# Patient Record
Sex: Female | Born: 1975 | Race: White | Hispanic: No | Marital: Married | Smoking: Never smoker
Health system: Southern US, Community
[De-identification: ages and names within clinical notes are randomized; demographics above are authoritative.]

## PROBLEM LIST (undated history)

## (undated) DIAGNOSIS — R079 Chest pain, unspecified: Secondary | ICD-10-CM

## (undated) DIAGNOSIS — E119 Type 2 diabetes mellitus without complications: Secondary | ICD-10-CM

## (undated) DIAGNOSIS — I10 Essential (primary) hypertension: Secondary | ICD-10-CM

## (undated) DIAGNOSIS — F419 Anxiety disorder, unspecified: Secondary | ICD-10-CM

## (undated) DIAGNOSIS — R609 Edema, unspecified: Secondary | ICD-10-CM

## (undated) DIAGNOSIS — Z87442 Personal history of urinary calculi: Secondary | ICD-10-CM

## (undated) HISTORY — PX: KNEE SURGERY: SHX244

## (undated) HISTORY — DX: Anxiety disorder, unspecified: F41.9

## (undated) HISTORY — DX: Personal history of urinary calculi: Z87.442

## (undated) HISTORY — DX: Type 2 diabetes mellitus without complications: E11.9

## (undated) HISTORY — PX: HYSTEROSCOPY: SHX211

## (undated) HISTORY — DX: Essential (primary) hypertension: I10

## (undated) HISTORY — DX: Chest pain, unspecified: R07.9

## (undated) HISTORY — DX: Edema, unspecified: R60.9

## (undated) HISTORY — PX: OVARIAN CYST REMOVAL: SHX89

---

## 1998-09-09 ENCOUNTER — Other Ambulatory Visit: Admission: RE | Admit: 1998-09-09 | Discharge: 1998-09-09 | Payer: Self-pay | Admitting: Obstetrics and Gynecology

## 2016-01-11 ENCOUNTER — Encounter (HOSPITAL_COMMUNITY): Payer: Self-pay | Admitting: Emergency Medicine

## 2016-01-11 ENCOUNTER — Emergency Department (HOSPITAL_COMMUNITY)
Admission: EM | Admit: 2016-01-11 | Discharge: 2016-01-11 | Disposition: A | Discharge status: Home / Self Care | Payer: No Typology Code available for payment source | Attending: Emergency Medicine | Admitting: Emergency Medicine

## 2016-01-11 DIAGNOSIS — Y999 Unspecified external cause status: Secondary | ICD-10-CM | POA: Insufficient documentation

## 2016-01-11 DIAGNOSIS — Y92410 Unspecified street and highway as the place of occurrence of the external cause: Secondary | ICD-10-CM | POA: Insufficient documentation

## 2016-01-11 DIAGNOSIS — S29012A Strain of muscle and tendon of back wall of thorax, initial encounter: Secondary | ICD-10-CM | POA: Diagnosis not present

## 2016-01-11 DIAGNOSIS — Y9389 Activity, other specified: Secondary | ICD-10-CM | POA: Diagnosis not present

## 2016-01-11 DIAGNOSIS — Z792 Long term (current) use of antibiotics: Secondary | ICD-10-CM | POA: Diagnosis not present

## 2016-01-11 DIAGNOSIS — S161XXA Strain of muscle, fascia and tendon at neck level, initial encounter: Secondary | ICD-10-CM | POA: Diagnosis not present

## 2016-01-11 DIAGNOSIS — S299XXA Unspecified injury of thorax, initial encounter: Secondary | ICD-10-CM | POA: Diagnosis present

## 2016-01-11 DIAGNOSIS — Z79899 Other long term (current) drug therapy: Secondary | ICD-10-CM | POA: Diagnosis not present

## 2016-01-11 DIAGNOSIS — S39012A Strain of muscle, fascia and tendon of lower back, initial encounter: Secondary | ICD-10-CM

## 2016-01-11 LAB — URINALYSIS, ROUTINE W REFLEX MICROSCOPIC
Bilirubin Urine: NEGATIVE
Glucose, UA: NEGATIVE mg/dL
KETONES UR: NEGATIVE mg/dL
LEUKOCYTES UA: NEGATIVE
Nitrite: NEGATIVE
PROTEIN: NEGATIVE mg/dL
Specific Gravity, Urine: 1.01 (ref 1.005–1.030)
pH: 6 (ref 5.0–8.0)

## 2016-01-11 LAB — URINE MICROSCOPIC-ADD ON: WBC, UA: NONE SEEN WBC/hpf (ref 0–5)

## 2016-01-11 LAB — POC URINE PREG, ED: PREG TEST UR: NEGATIVE

## 2016-01-11 MED ORDER — CHLORZOXAZONE 500 MG PO TABS: 500.0000 mg | ORAL_TABLET | Freq: Three times a day (TID) | ORAL | 0 refills | Status: DC | PRN

## 2016-01-11 MED ORDER — DICLOFENAC SODIUM 75 MG PO TBEC: 75.0000 mg | DELAYED_RELEASE_TABLET | Freq: Two times a day (BID) | ORAL | 0 refills | Status: DC

## 2016-01-11 NOTE — Discharge Instructions (Signed)
Your vital signs within normal limits. Please use Tylenol for mild discomfort. Please use Parafon forte for muscle strain/pain. Use diclofenac 2 times daily for inflammation and pain. Parafon forte may cause drowsiness, please do not drink alcohol, drive a vehicle, operating machinery, or participate in activities requiring concentration when taking this medication. Please see your primary physician, or return to the emergency department if any changes in your condition.
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
## 2019-01-15 ENCOUNTER — Other Ambulatory Visit: Payer: Self-pay | Admitting: *Deleted

## 2019-01-15 DIAGNOSIS — Z20822 Contact with and (suspected) exposure to covid-19: Secondary | ICD-10-CM

## 2019-01-17 LAB — NOVEL CORONAVIRUS, NAA: SARS-CoV-2, NAA: NOT DETECTED

## 2019-06-09 ENCOUNTER — Ambulatory Visit: Payer: BC Managed Care – PPO | Attending: Internal Medicine

## 2019-06-09 DIAGNOSIS — Z23 Encounter for immunization: Secondary | ICD-10-CM | POA: Insufficient documentation

## 2019-06-09 NOTE — Progress Notes (Signed)
   Covid-19 Vaccination Clinic  Name:  Yvette Patterson    MRN: [id number] DOB: [birth year]  06/09/2019  [name] was observed post Covid-19 immunization for 15 minutes without incident. She was provided with Vaccine Information Sheet and instruction to access the V-Safe system.   [name] was instructed to call 911 with any severe reactions post vaccine: Marland Kitchen Difficulty breathing  . Swelling of face and throat  . A fast heartbeat  . A bad rash all over body  . Dizziness and weakness   Immunizations Administered    Name Date Dose VIS Date Route   Pfizer COVID-19 Vaccine 06/09/2019  8:42 AM 0.3 mL 03/15/2019 Intramuscular   Manufacturer: [employer], [employer]   Lot: [id number]   NDC: [address]-3

## 2019-06-30 ENCOUNTER — Ambulatory Visit: Payer: Self-pay | Attending: Internal Medicine

## 2019-06-30 DIAGNOSIS — Z23 Encounter for immunization: Secondary | ICD-10-CM

## 2019-06-30 NOTE — Progress Notes (Signed)
   Covid-19 Vaccination Clinic  Name:  RYA RAUSCH    MRN: [id number] DOB: 1975-09-01  06/30/2019  Ms. Pensyl was observed post Covid-19 immunization for 15 minutes without incident. She was provided with Vaccine Information Sheet and instruction to access the V-Safe system.   Ms. Cobern was instructed to call 911 with any severe reactions post vaccine: Marland Kitchen Difficulty breathing  . Swelling of face and throat  . A fast heartbeat  . A bad rash all over body  . Dizziness and weakness   Immunizations Administered    Name Date Dose VIS Date Route   Pfizer COVID-19 Vaccine 06/30/2019  8:43 AM 0.3 mL 03/15/2019 Intramuscular   Manufacturer: [employer], [employer]   Lot: [id number]   NDC: [address]-9

## 2019-08-12 ENCOUNTER — Ambulatory Visit: Payer: BC Managed Care – PPO | Admitting: Urology

## 2019-08-20 ENCOUNTER — Ambulatory Visit (INDEPENDENT_AMBULATORY_CARE_PROVIDER_SITE_OTHER): Payer: BC Managed Care – PPO | Admitting: Urology

## 2019-08-20 ENCOUNTER — Other Ambulatory Visit (HOSPITAL_COMMUNITY)
Admission: RE | Admit: 2019-08-20 | Discharge: 2019-08-20 | Disposition: A | Discharge status: Home / Self Care | Payer: BC Managed Care – PPO | Source: Other Acute Inpatient Hospital | Attending: Urology | Admitting: Urology

## 2019-08-20 ENCOUNTER — Encounter: Payer: Self-pay | Admitting: Urology

## 2019-08-20 ENCOUNTER — Other Ambulatory Visit: Payer: Self-pay

## 2019-08-20 VITALS — BP 148/92 | HR 88 | Temp 97.7°F | Ht 67.0 in | Wt 300.0 lb

## 2019-08-20 DIAGNOSIS — R300 Dysuria: Secondary | ICD-10-CM | POA: Insufficient documentation

## 2019-08-20 DIAGNOSIS — R319 Hematuria, unspecified: Secondary | ICD-10-CM | POA: Diagnosis not present

## 2019-08-20 LAB — POCT URINALYSIS DIPSTICK
Bilirubin, UA: NEGATIVE
Glucose, UA: NEGATIVE
Ketones, UA: NEGATIVE
Nitrite, UA: NEGATIVE
Protein, UA: POSITIVE — AB
Spec Grav, UA: 1.025 (ref 1.010–1.025)
Urobilinogen, UA: 0.2 E.U./dL
pH, UA: 6 (ref 5.0–8.0)

## 2019-08-20 NOTE — Progress Notes (Signed)
See H& P.

## 2019-08-20 NOTE — Progress Notes (Signed)
H&P  Chief Complaint: Recurrent Cystitis   History of Present Illness: Yvette Patterson is a 44 y.o. year old female who presents today having had recurrent urinary tract infections from February to April 2021. At onset of her sx's, she first noticed increased bladder pressure with associated urinary freq and urgency. She eventually developed dysuria, flank pain, as well as occasional blood per urine (very light). She tested positive on UA for infection and was subsequently treated -- her sx's persisted through multiple courses of abx and only recently resolved (last felt symptoms in early April). Her urine cultures were positive for strep and e. Coli. She felt that the abx's she was given improved her sx's mildly but not completely (which is what prompted her to return for further treatment and different antibiotics). She denies any baseline urinary issues nor a common sense of incomplete emptying. She has had no recent imaging for her kidneys/bladder. She has had yeast infections since starting on abx's for her recurrent UTI's.   She does have a hx of kidney stones (at least 2 episodes) and does not think she required any interventions for these -- her memory of these events are not very clear.   Past Medical History:  Diagnosis Date  . Anxiety   . Diabetes mellitus without complication ([facility])   . History of kidney stones   . Hypertension     Past Surgical History:  Procedure Laterality Date  . HYSTEROSCOPY    . KNEE SURGERY    . OVARIAN CYST REMOVAL      Home Medications:  (Not in a hospital admission)   Allergies: No Known Allergies  Family History  Problem Relation Age of Onset  . Colon cancer Father   . Diabetes Father   . Stroke Father   . Kidney Stones Mother   . Colon cancer Paternal Grandfather     Social History:  reports that she has never smoked. She has never used smokeless tobacco. She reports current alcohol use. She reports that she does not use  drugs.  ROS: Urological Symptom Review Patient is experiencing the following symptoms: Get up at night to urinate Urinary tract infection Kidney Stones Review of Systems Gastrointestinal (upper)  : Negative for upper GI symptoms Gastrointestinal (lower) : Negative for lower GI symptoms Constitutional : Fever Skin: Negative for skin symptoms Eyes: Negative for eye symptoms Ear/Nose/Throat : Negative for Ear/Nose/Throat symptoms Hematologic/Lymphatic: Negative for Hematologic/Lymphatic symptoms Cardiovascular : Leg swelling Respiratory : Negative for respiratory symptoms Endocrine: Negative for endocrine symptoms Musculoskeletal: Negative for musculoskeletal symptoms Neurological: Negative for neurological symptoms Psychologic: Anxiety  Physical Exam:  Vital signs in last 24 hours: Temp:  [97.7 F (36.5 C)] 97.7 F (36.5 C) (05/18 1524) Pulse Rate:  [88] 88 (05/18 1524) BP: (148)/(92) 148/92 (05/18 1524) Weight:  [300 lb (136.1 kg)] 300 lb (136.1 kg) (05/18 1524) General:  Alert and oriented, No acute distress, Obese HEENT: Normocephalic, atraumatic Neck: No JVD or lymphadenopathy Cardiovascular: Regular rate  Lungs: Normal inspiratory/expiratory excursion Abdomen: Obese Extremities: No edema Neurologic: Grossly intact  Laboratory Data:  Results for orders placed or performed in visit on 08/20/19 (from the past 24 hour(s))  POCT urinalysis dipstick     Status: Abnormal   Collection Time: 08/20/19  3:32 PM  Result Value Ref Range   Color, UA yellow    Clarity, UA     Glucose, UA Negative Negative   Bilirubin, UA neg    Ketones, UA neg    Spec Grav, UA  1.025 1.010 - 1.025   Blood, UA +    pH, UA 6.0 5.0 - 8.0   Protein, UA Positive (A) Negative   Urobilinogen, UA 0.2 0.2 or 1.0 E.U./dL   Nitrite, UA neg    Leukocytes, UA Moderate (2+) (A) Negative   Appearance clear    Odor     I have reviewed prior pt notes  I have reviewed notes from  referring/previous physicians  I have reviewed urinalysis results  Impression/Assessment:  She has not felt as thought she has been symptomatic since early April. UA today is POS for leukocytes and trace blood -- will culture. Sx's could be secondary to kidney stone given her hx though they just as well could have been due to UTI that has since been treated (especially with her recent pelvic surgery). Will also eval with RUS.   Plan:  1. Urine culture -- will forward results.  2. RUS -- will call to follow-up with results and plan appropriate next steps if needed  3. Advised to start on daily probiotic for a few days.   Luella Cook 08/20/2019, 3:54 PM  Bertram Millard. Dahlstedt MD

## 2019-08-22 LAB — URINE CULTURE: Culture: 80000 — AB

## 2019-08-23 ENCOUNTER — Telehealth: Payer: Self-pay

## 2019-08-23 NOTE — Telephone Encounter (Signed)
Pt calling for urine culture results. MD message sent

## 2019-08-23 NOTE — Telephone Encounter (Signed)
Let patient know that urine culture was positive, a few bacteria grew out, commonly found in healthy people.  At this point, since she is not symptomatic, I would strongly recommend against treatment.  I think she should continue on cranberry capsules, drinking plenty of fluids.  If she becomes symptomatic later she can let us know.    This results did not show up  in my box before  you sent me the message.

## 2019-08-23 NOTE — Telephone Encounter (Signed)
Called pt and left message

## 2019-09-10 ENCOUNTER — Ambulatory Visit (HOSPITAL_COMMUNITY): Payer: BC Managed Care – PPO

## 2019-09-17 ENCOUNTER — Other Ambulatory Visit: Payer: Self-pay

## 2019-09-17 ENCOUNTER — Ambulatory Visit (HOSPITAL_COMMUNITY)
Admission: RE | Admit: 2019-09-17 | Discharge: 2019-09-17 | Disposition: A | Discharge status: Home / Self Care | Payer: BC Managed Care – PPO | Source: Ambulatory Visit | Attending: Urology | Admitting: Urology

## 2019-09-17 DIAGNOSIS — R319 Hematuria, unspecified: Secondary | ICD-10-CM

## 2019-09-17 IMAGING — US US RENAL
1 series · 14 of 25 positions shown · non-contrast
Comparison: None.

CLINICAL DATA: 44-year-old female with recurrent urinary tract
infections. Stones.

EXAM:
RENAL / URINARY TRACT ULTRASOUND COMPLETE

[Series 1: us renal · 14 of 43 slices shown]
[im 1/43]
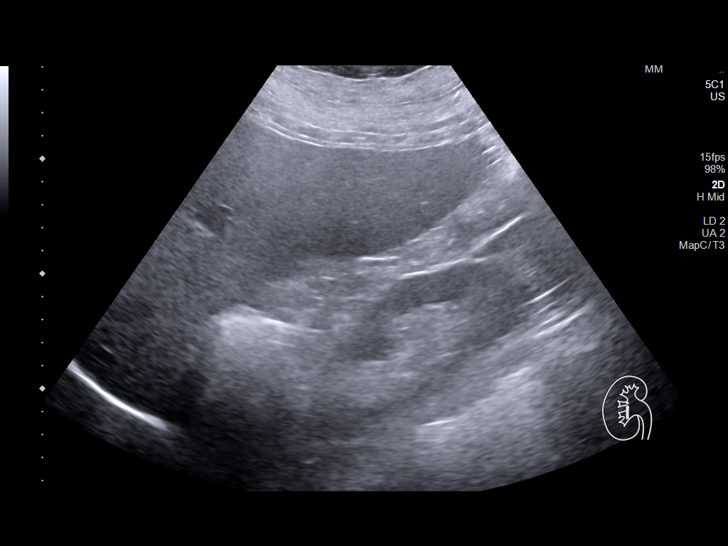
[im 4/43]
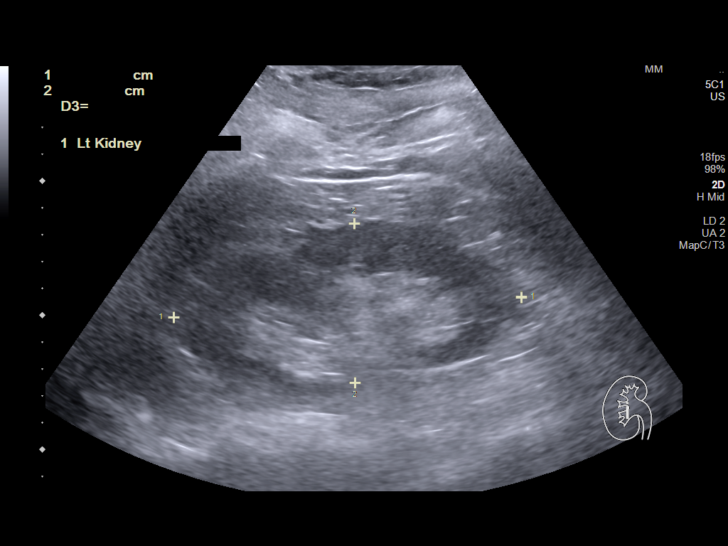
[im 8/43]
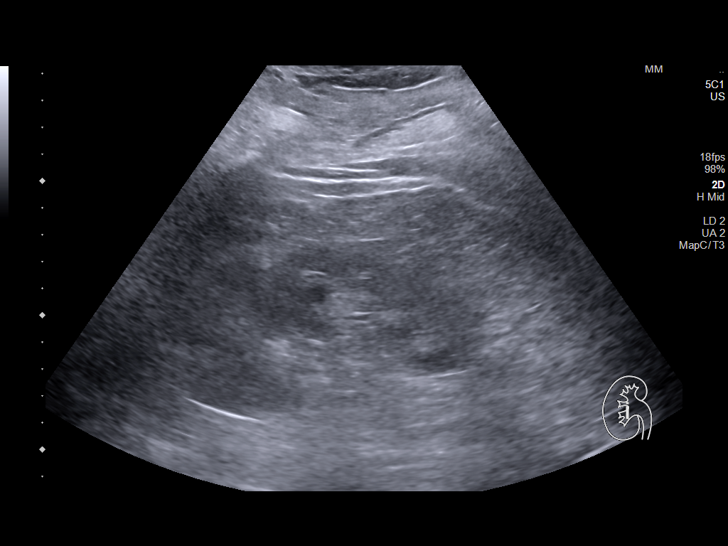
[im 11/43]
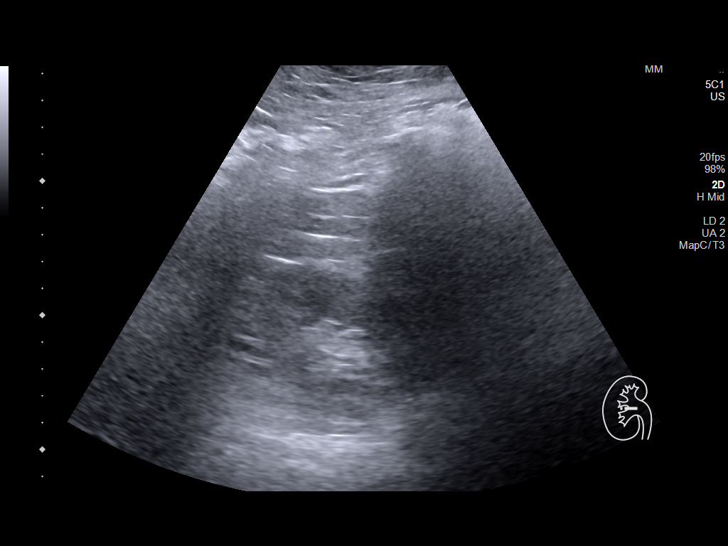
[im 15/43]
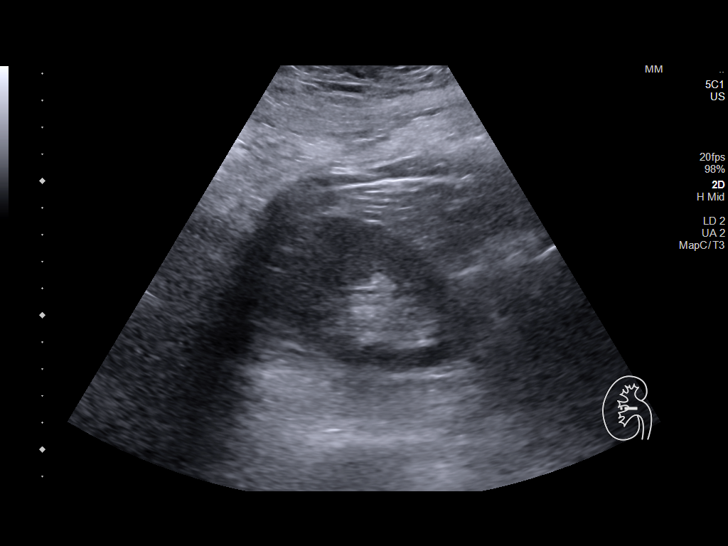
[im 16/43]
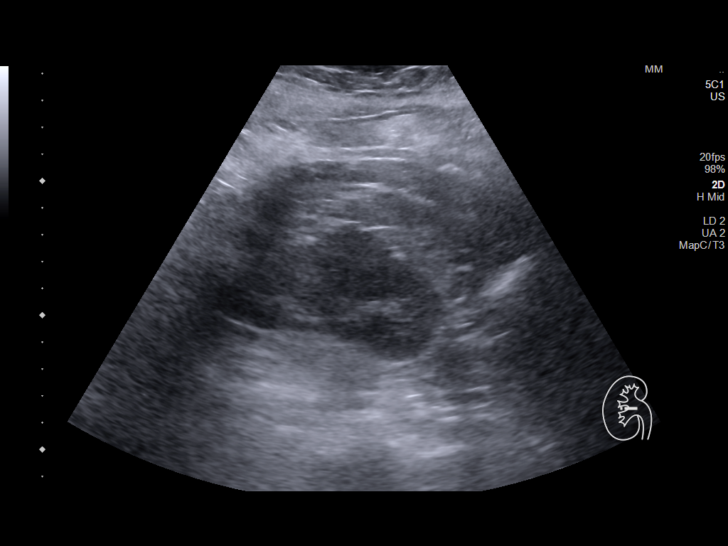
[im 20/43]
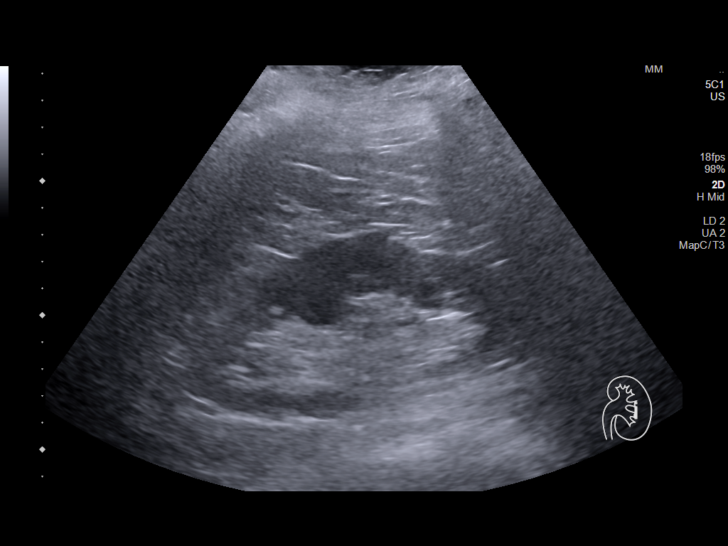
[im 23/43]
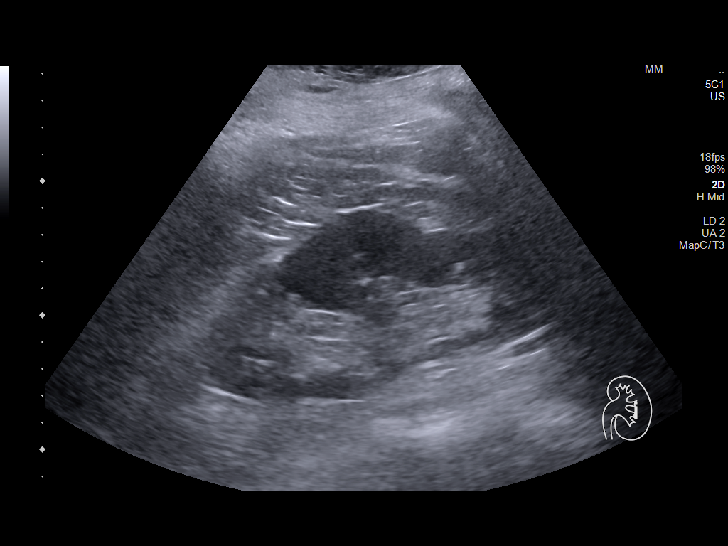
[im 27/43]
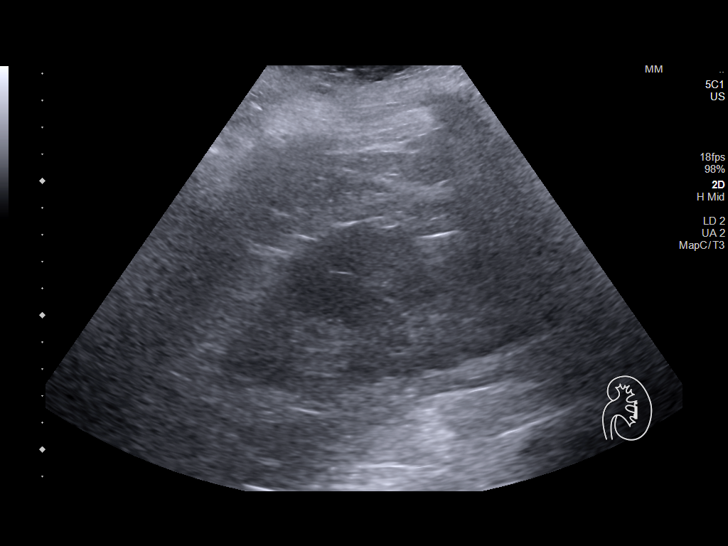
[im 29/43]
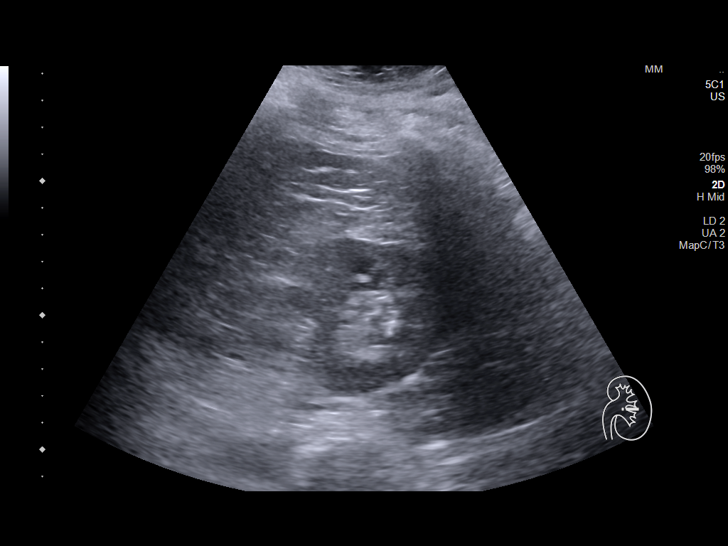
[im 32/43]
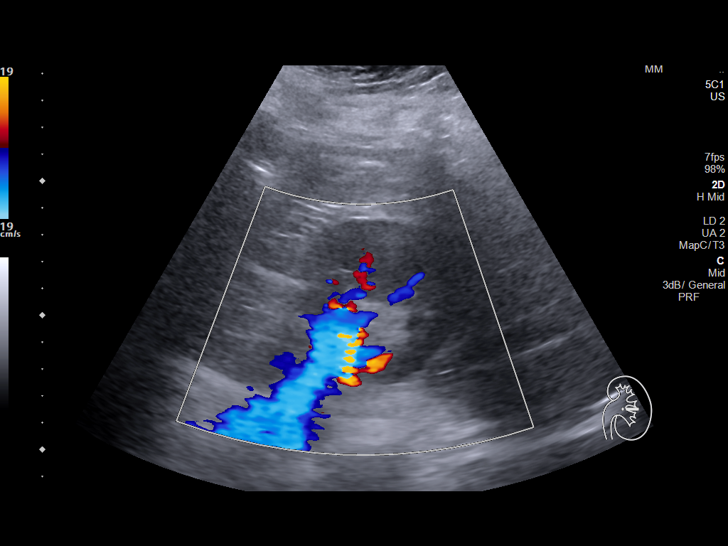
[im 36/43]
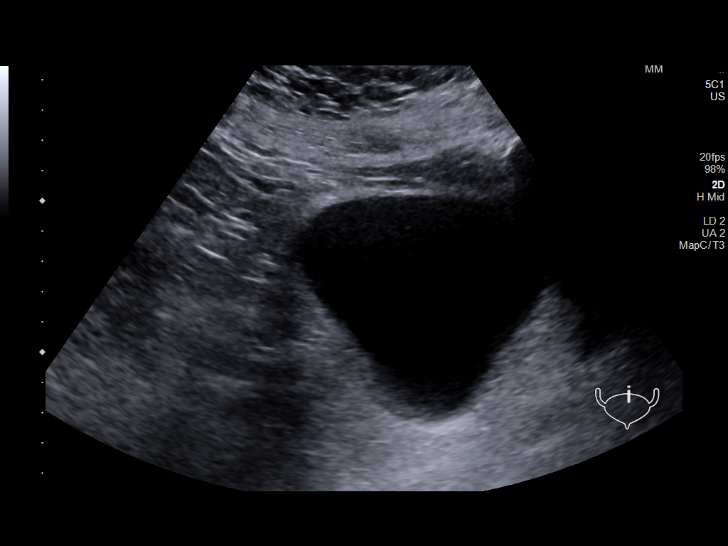
[im 39/43]
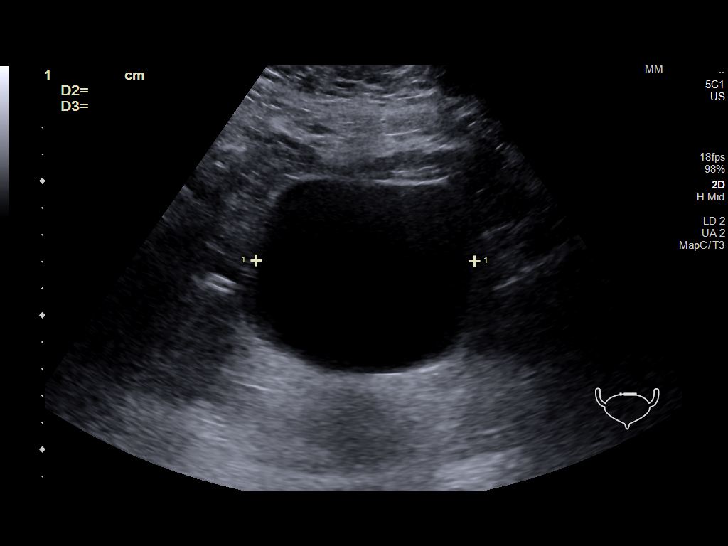
[im 43/43]
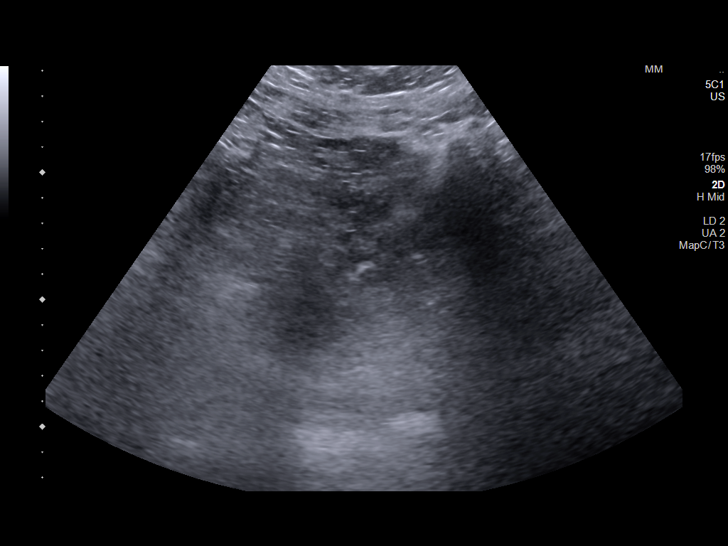

[14 of 25 positions shown; findings below may reference images not displayed]

FINDINGS: Right Kidney:

Renal measurements: 13.0 x 5.9 x 7.3 cm = volume: 292 mL .
Echogenicity within normal limits. No mass or hydronephrosis
visualized.

Left Kidney:

Renal measurements: 13.9 x 6.4 x 6.0 cm = volume: 280 mL.
Echogenicity within normal limits. No mass or hydronephrosis
visualized.

Bladder:

Appears normal for degree of bladder distention. No urinary debris.
No postvoid residual.

Other:

None.
IMPRESSION: Normal ultrasound appearance of both kidneys and the urinary
bladder. No urinary calculus evident by ultrasound.

## 2020-07-17 ENCOUNTER — Ambulatory Visit: Payer: BC Managed Care – PPO | Admitting: Neurology

## 2020-07-28 ENCOUNTER — Encounter: Payer: Self-pay | Admitting: Neurology

## 2020-07-28 ENCOUNTER — Encounter: Payer: Self-pay | Admitting: Internal Medicine

## 2020-07-28 ENCOUNTER — Ambulatory Visit: Payer: BC Managed Care – PPO | Admitting: Neurology

## 2020-07-28 VITALS — BP 168/88 | HR 89 | Ht 67.0 in | Wt 304.8 lb

## 2020-07-28 DIAGNOSIS — R202 Paresthesia of skin: Secondary | ICD-10-CM | POA: Diagnosis not present

## 2020-07-28 DIAGNOSIS — R413 Other amnesia: Secondary | ICD-10-CM | POA: Diagnosis not present

## 2020-07-28 NOTE — Progress Notes (Signed)
Reason for visit: Left hand numbness  Referring physician: Dr. Francella Patterson is a 45 y.o. female  History of present illness:  Yvette Patterson is a 45 year old right-handed white female with a history of morbid obesity and borderline diabetes.  The patient began having onset of left fifth finger numbness that she noted upon awakening on 10 April 2020.  This numbness has persisted and has gradually spread to include the fourth finger as well.  The patient reports no definite weakness of the hand, she denies any discomfort of the hand, wrist, elbow, shoulder, or neck on that side.  She denies any numbness in the feet or on the right hand.  She has not had any change in balance.  She did have an event within the last month or 2 where she felt as if the left leg was going to collapse, it felt slightly weak.  This has since resolved.  Within the last year, she has noted some gradual change in her cognitive capacity.  The patient is having increasing problems with focusing.  She is having a parallel increase in fatigue.  She has also noted early morning headaches that will tend to occur once or twice a week.  She will get up out of bed with a headache and within 15 or 20 minutes the headache will go away.  She indicates that she does snore at night.  She most recently had a hemoglobin A1c of 6.2, she has a prescription for metformin but does not take it.  She is sent to this office for further evaluation.  Past Medical History:  Diagnosis Date  . Anxiety   . Diabetes mellitus without complication ([facility])   . History of kidney stones   . Hypertension     Past Surgical History:  Procedure Laterality Date  . HYSTEROSCOPY    . KNEE SURGERY    . OVARIAN CYST REMOVAL      Family History  Problem Relation Age of Onset  . Colon cancer Father   . Diabetes Father   . Stroke Father   . Kidney Stones Mother   . Colon cancer Paternal Grandfather     Social history:  reports that she has  never smoked. She has never used smokeless tobacco. She reports current alcohol use. She reports that she does not use drugs.  Medications:  Prior to Admission medications   Medication Sig Start Date End Date Taking? Authorizing Provider  acetaminophen (TYLENOL) 500 MG tablet Take 500 mg by mouth every 6 (six) hours as needed.   Yes [provider]  fluticasone Aleda Grana) 50 MCG/ACT nasal spray  01/08/16  Yes [provider]  levocetirizine (XYZAL) 2.5 MG/5ML solution Take 2.5 mg by mouth every evening.   Yes [provider]  metFORMIN (GLUCOPHAGE) 500 MG tablet Take by mouth.   Yes [provider]  triamcinolone cream (KENALOG) 0.5 % Apply 1 application topically 3 (three) times daily.   Yes [provider]  aspirin 81 MG EC tablet Take by mouth.    [provider]  azithromycin (ZITHROMAX) 250 MG tablet Take 250 mg by mouth daily.  01/08/16   [provider]  chlorzoxazone (PARAFON FORTE DSC) 500 MG tablet Take 1 tablet (500 mg total) by mouth 3 (three) times daily as needed for muscle spasms. 01/11/16   Ivery Quale, PA-C  diclofenac (VOLTAREN) 75 MG EC tablet Take 1 tablet (75 mg total) by mouth 2 (two) times daily. 01/11/16  Ivery Quale, PA-C  loratadine (CLARITIN) 10 MG tablet Take 10 mg by mouth daily.    [provider]  losartan (COZAAR) 25 MG tablet Take 25 mg by mouth daily. 06/29/19   [provider]  Pseudoephedrine-APAP-DM (DAYQUIL PO) Take 2 capsules by mouth daily.    [provider]     No Known Allergies  ROS:  Out of a complete 14 system review of symptoms, the patient complains only of the following symptoms, and all other reviewed systems are negative.  Headache Left hand numbness Decreased memory  Blood pressure (!) 168/88, pulse 89, height 5\' 7"  (1.702 m), weight (!) 304 lb 12.8 oz (138.3 kg).  Physical Exam  General: The patient is alert and cooperative at the time of the  examination.  The patient is morbidly obese.  Eyes: Pupils are equal, round, and reactive to light. Discs are flat bilaterally.  Neck: The neck is supple, no carotid bruits are noted.  Respiratory: The respiratory examination is clear.  Cardiovascular: The cardiovascular examination reveals a regular rate and rhythm, no obvious murmurs or rubs are noted.  Skin: Extremities are with 2+ edema below the knees bilaterally.  Neurologic Exam  Mental status: The patient is alert and oriented x 3 at the time of the examination. The patient has apparent normal recent and remote memory, with an apparently normal attention span and concentration ability.  Cranial nerves: Facial symmetry is present. There is good sensation of the face to pinprick and soft touch bilaterally. The strength of the facial muscles and the muscles to head turning and shoulder shrug are normal bilaterally. Speech is well enunciated, no aphasia or dysarthria is noted. Extraocular movements are full. Visual fields are full. The tongue is midline, and the patient has symmetric elevation of the soft palate. No obvious hearing deficits are noted.  Motor: The motor testing reveals 5 over 5 strength of all 4 extremities. Good symmetric motor tone is noted throughout.  Sensory: Sensory testing is intact to pinprick, soft touch, vibration sensation, and position sense on all 4 extremities, with exception of a slight decrease in pinprick sensation on the left fifth finger. No evidence of extinction is noted.  Coordination: Cerebellar testing reveals good finger-nose-finger and heel-to-shin bilaterally.  Gait and station: Gait is normal. Tandem gait is slightly unsteady. Romberg is negative. No drift is seen.  Reflexes: Deep tendon reflexes are symmetric and normal bilaterally. Toes are downgoing bilaterally.   Assessment/Plan:  1.  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  Reports of decreased memory, fatigue,  early morning headache  The patient appears to have some numbness in the distribution of the ulnar nerve on the left.  We will check nerve conduction studies on both arms and EMG on the left arm.  The patient also has some reports of a decline in memory, increased fatigue, and episodes of early morning headache that may be consistent with sleep apnea.  I have urged her to initiate a weight loss plan.  We will check MRI of the brain to exclude demyelinating disease given the report of the left hand and left leg issues, if this is unremarkable, I will set the patient up for sleep evaluation.  She will follow-up for the EMG.  MD 07/28/2020 1:28 PM  [facility] [address] [place], [place]  Phone [phone] Fax [phone]

## 2020-07-29 ENCOUNTER — Telehealth: Payer: Self-pay | Admitting: Neurology

## 2020-07-29 NOTE — Telephone Encounter (Signed)
LVM for pt to call back about scheduling mri  [employer] auth: [id number] (exp. 07/29/20 to 01/24/21)

## 2020-08-11 ENCOUNTER — Ambulatory Visit: Payer: BC Managed Care – PPO | Admitting: Neurology

## 2020-08-11 ENCOUNTER — Telehealth: Payer: Self-pay | Admitting: Neurology

## 2020-08-11 ENCOUNTER — Ambulatory Visit (INDEPENDENT_AMBULATORY_CARE_PROVIDER_SITE_OTHER): Payer: BC Managed Care – PPO | Admitting: Neurology

## 2020-08-11 ENCOUNTER — Encounter: Payer: Self-pay | Admitting: Neurology

## 2020-08-11 DIAGNOSIS — R202 Paresthesia of skin: Secondary | ICD-10-CM | POA: Diagnosis not present

## 2020-08-11 DIAGNOSIS — G5622 Lesion of ulnar nerve, left upper limb: Secondary | ICD-10-CM

## 2020-08-11 DIAGNOSIS — G5603 Carpal tunnel syndrome, bilateral upper limbs: Secondary | ICD-10-CM

## 2020-08-11 HISTORY — DX: Lesion of ulnar nerve, left upper limb: G56.22

## 2020-08-11 HISTORY — DX: Carpal tunnel syndrome, bilateral upper limbs: G56.03

## 2020-08-11 NOTE — Procedures (Signed)
     HISTORY:  Yvette Patterson is a 45 year old patient with marked obesity who has developed some numbness in the ulnar aspect of the left hand.  The patient is being evaluated for this issue.  NERVE CONDUCTION STUDIES:  Nerve conduction studies were performed on both upper extremities.  The distal motor latencies for the median nerves were prolonged bilaterally with normal motor amplitudes for these nerves bilaterally.  The distal motor latencies and motor amplitudes for the ulnar nerves were normal bilaterally.  Nerve conduction velocities for the median and ulnar nerves were normal bilaterally with exception that there is a significant drop off of nerve conduction velocity across the elbow on the left.  The sensory latencies for the median nerves were prolonged bilaterally and were normal for the ulnar nerves bilaterally.  The F-wave latencies for the ulnar nerves were normal.  EMG STUDIES:  EMG study was performed on the left upper extremity:  The first dorsal interosseous muscle reveals 2 to 4 K units with full recruitment. No fibrillations or positive waves were noted. The abductor pollicis brevis muscle reveals 2 to 4 K units with full recruitment. No fibrillations or positive waves were noted. The extensor indicis proprius muscle reveals 1 to 3 K units with full recruitment. No fibrillations or positive waves were noted. The flexor digitorum profundus muscle (III-IV) reveals 2 to 3 K units with full recruitment.  No fibrillations or positive waves were noted. The pronator teres muscle reveals 2 to 3 K units with full recruitment. No fibrillations or positive waves were noted. The biceps muscle reveals 1 to 2 K units with full recruitment. No fibrillations or positive waves were noted. The triceps muscle reveals 2 to 4 K units with full recruitment. No fibrillations or positive waves were noted. The anterior deltoid muscle reveals 2 to 3 K units with full recruitment. No fibrillations or  positive waves were noted. The cervical paraspinal muscles were tested at 2 levels. No abnormalities of insertional activity were seen at either level tested. There was good relaxation.   IMPRESSION:  Nerve conduction studies done on both upper extremities shows evidence of mild bilateral carpal tunnel syndrome.  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  EMG evaluation of the left upper extremity was otherwise unremarkable, no evidence of an overlying cervical radiculopathy was seen.  Marlan Palau MD 08/11/2020 3:11 PM  [facility] [address] [place], [place] [address]  Phone [phone] Fax [phone]

## 2020-08-11 NOTE — Telephone Encounter (Signed)
Scheduled at [facility] 08/18/20 30 mins MR Brain wo contrast Dr. Lockie Mola Berkley Harvey #[id number] exp. 07/29/20-01/24/21

## 2020-08-11 NOTE — Progress Notes (Signed)
Please refer to EMG and nerve conduction procedure note.  

## 2020-08-11 NOTE — Progress Notes (Addendum)
The patient comes in today for EMG and nerve conduction study evaluation.  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  She is to get an elbow pad to protect the elbow during the day and invertedly pad at night to keep the arm from flexing.  She may get wrist splints for both wrists, she does have some minimal symptoms of numbness in the hands off and on.  MRI of the brain is pending, we may get a  sleep evaluation if this is unremarkable.     MNC    Nerve / Sites Muscle Latency Ref. Amplitude Ref. Rel Amp Segments Distance Velocity Ref. Area    ms ms mV mV %  cm m/s m/s mVms  R Median - APB     Wrist APB 5.2 ?4.4 9.1 ?4.0 100 Wrist - APB 7   31.6     Upper arm APB 9.3  8.8  97.6 Upper arm - Wrist 21 51 ?49 30.9  L Median - APB     Wrist APB 4.4 ?4.4 6.9 ?4.0 100 Wrist - APB 7   26.3     Upper arm APB 8.6  6.6  95 Upper arm - Wrist 22 53 ?49 24.0  R Ulnar - ADM     Wrist ADM 2.4 ?3.3 9.3 ?6.0 100 Wrist - ADM 7   26.2     B.Elbow ADM 5.7  9.6  104 B.Elbow - Wrist 20 60 ?49 25.5     A.Elbow ADM 7.4  9.5  98.2 A.Elbow - B.Elbow 10 60 ?49 25.9  L Ulnar - ADM     Wrist ADM 2.4 ?3.3 8.5 ?6.0 100 Wrist - ADM 7   20.5     B.Elbow ADM 5.2  7.6  89.4 B.Elbow - Wrist 18 63 ?49 21.0     A.Elbow ADM 7.7  7.1  93.9 A.Elbow - B.Elbow 10 41 ?49 20.4             SNC    Nerve / Sites Rec. Site Peak Lat Ref.  Amp Ref. Segments Distance    ms ms V V  cm  R Median - Orthodromic (Dig II, Mid palm)     Dig II Wrist 4.1 ?3.4 10 ?10 Dig II - Wrist 13  L Median - Orthodromic (Dig II, Mid palm)     Dig II Wrist 4.0 ?3.4 10 ?10 Dig II - Wrist 13  R Ulnar - Orthodromic, (Dig V, Mid palm)     Dig V Wrist 2.4 ?3.1 6 ?5 Dig V - Wrist 11  L Ulnar - Orthodromic, (Dig V, Mid palm)     Dig V Wrist 2.6 ?3.1 5 ?5 Dig V - Wrist 45             F  Wave    Nerve F Lat Ref.   ms ms  R Ulnar - ADM 26.9 ?32.0  L Ulnar - ADM 29.1 ?32.0

## 2020-08-18 ENCOUNTER — Ambulatory Visit: Payer: BC Managed Care – PPO

## 2020-08-18 DIAGNOSIS — R413 Other amnesia: Secondary | ICD-10-CM | POA: Diagnosis not present

## 2020-08-19 ENCOUNTER — Telehealth: Payer: Self-pay | Admitting: Neurology

## 2020-08-19 NOTE — Telephone Encounter (Signed)
I called the patient.  MRI of the brain is normal.  As previously discussed, we will consider a sleep study.  If the patient is amenable to this, she is to contact our office and let us know.   MRI brain 08/19/20:  IMPRESSION:   Normal MRI brain (without).

## 2020-09-09 ENCOUNTER — Other Ambulatory Visit: Payer: Self-pay

## 2020-09-09 ENCOUNTER — Encounter: Payer: Self-pay | Admitting: Internal Medicine

## 2020-09-09 ENCOUNTER — Ambulatory Visit (INDEPENDENT_AMBULATORY_CARE_PROVIDER_SITE_OTHER): Payer: BC Managed Care – PPO | Admitting: Internal Medicine

## 2020-09-09 VITALS — BP 171/91 | HR 90 | Temp 97.5°F | Ht 67.0 in | Wt 303.0 lb

## 2020-09-09 DIAGNOSIS — K769 Liver disease, unspecified: Secondary | ICD-10-CM

## 2020-09-09 DIAGNOSIS — R197 Diarrhea, unspecified: Secondary | ICD-10-CM

## 2020-09-09 DIAGNOSIS — Z800 Family history of malignant neoplasm of digestive organs: Secondary | ICD-10-CM

## 2020-09-09 DIAGNOSIS — R110 Nausea: Secondary | ICD-10-CM | POA: Diagnosis not present

## 2020-09-09 MED ORDER — SUPREP BOWEL PREP KIT 17.5-3.13-1.6 GM/177ML PO SOLN: 1.0000 | ORAL | 0 refills | Status: DC

## 2020-09-09 NOTE — Patient Instructions (Signed)
We will schedule you for colonoscopy to further evaluate your episodes of diarrhea as well as for screening purposes due to family history of colon cancer in your father and grandfather.  Further recommendations to follow.  At [facility] we value your feedback. You may receive a survey about your visit today. Please share your experience as we strive to create trusting relationships with our patients to provide genuine, compassionate, quality care.  We appreciate your understanding and patience as we review any laboratory studies, imaging, and other diagnostic tests that are ordered as we care for you. Our office policy is 5 business days for review of these results, and any emergent or urgent results are addressed in a timely manner for your best interest. If you do not hear from our office in 1 week, please contact us.   We also encourage the use of MyChart, which contains your medical information for your review as well. If you are not enrolled in this feature, an access code is on this after visit summary for your convenience. Thank you for allowing [place] to be involved in your care.  It was great to see you today!  I hope you have a great rest of your summer!!    Hennie Duos. Marletta Lor, D.O. Gastroenterology and Hepatology [facility]

## 2020-09-09 NOTE — Progress Notes (Signed)
Primary Care Physician:  Sheela Stack Primary Gastroenterologist:  Dr. Marletta Lor  Chief Complaint  Patient presents with  . liver lesion  . Diarrhea    About every 6 weeks and will have several episode    HPI:   Yvette Patterson is a 45 y.o. female who presents to clinic today by referral from her PCP Wayland Denis for evaluation.  Patient has multiple GI complaints for me today.  She states every 4 to 6 weeks she will have episodes of diarrhea which she describes as her body cleansings itself.  States she will have numerous loose bowel movements during this time.  Does not believe these are associated with any types of food though she is unsure.  No abdominal pain.  No melena hematochezia.  Does note occasional nausea as well.  No unintentional weight loss.  She had an ultrasound which showed an indistinct liver lesion approximately 2.5 cm.  This was followed up with CT imaging which was negative.  Most recent liver function tests normal.    Patient does note family history of colon cancer both in her father (diagnosed in 25s) and grandfather  Past Medical History:  Diagnosis Date  . Anxiety   . Bilateral carpal tunnel syndrome 08/11/2020  . Diabetes mellitus without complication (HCC)   . History of kidney stones   . Hypertension   . 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 HYSTEROSCOPY    . KNEE SURGERY    . OVARIAN CYST REMOVAL      Current Outpatient Medications  Medication Sig Dispense Refill  . acetaminophen (TYLENOL) 500 MG tablet Take 500 mg by mouth every 6 (six) hours as needed.    . fluticasone (FLONASE) 50 MCG/ACT nasal spray As needed    . levocetirizine (XYZAL) 2.5 MG/5ML solution Take 2.5 mg by mouth every evening. As needed     No current facility-administered medications for this visit.    Allergies as of 09/09/2020 - Review Complete 09/09/2020  Allergen Reaction Noted  .  Lisinopril Swelling 05/28/2019    Family History  Problem Relation Age of Onset  . Colon cancer Father   . Diabetes Father   . Stroke Father   . Kidney Stones Mother   . Colon cancer Paternal Grandfather     Social History   Socioeconomic History  . Marital status: Married    Spouse name: Not on file  . Number of children: 1  . Years of education: Not on file  . Highest education level: Not on file  Occupational History  . Occupation: Clinical biochemist  Tobacco Use  . Smoking status: Never Smoker  . Smokeless tobacco: Never Used  Substance and Sexual Activity  . Alcohol use: Yes    Comment: occ  . Drug use: No  . Sexual activity: Not on file  Other Topics Concern  . Not on file  Social History Narrative   Lives with husband and son   Right Handed   Drinks 2-4 cup caffeine daily   Social Determinants of Health   Financial Resource Strain: Not on file  Food Insecurity: Not on file  Transportation Needs: Not on file  Physical Activity: Not on file  Stress: Not on file  Social Connections: Not on file  Intimate Partner Violence: Not on file    Subjective: Review of Systems  Constitutional: Negative for chills and fever.  HENT: Negative for congestion and hearing  loss.   Eyes: Negative for blurred vision and double vision.  Respiratory: Negative for cough and shortness of breath.   Cardiovascular: Negative for chest pain and palpitations.  Gastrointestinal: Positive for abdominal pain and diarrhea. Negative for blood in stool, constipation, heartburn, melena and vomiting.  Genitourinary: Negative for dysuria and urgency.  Musculoskeletal: Negative for joint pain and myalgias.  Skin: Negative for itching and rash.  Neurological: Negative for dizziness and headaches.  Psychiatric/Behavioral: Negative for depression. The patient is not nervous/anxious.        Objective: BP (!) 171/91   Pulse 90   Temp (!) 97.5 F (36.4 C)   Ht 5\' 7"  (1.702 m)   Wt (!) 303  lb (137.4 kg)   LMP 09/02/2020   BMI 47.46 kg/m  Physical Exam Constitutional:      Appearance: Normal appearance. She is obese.  HENT:     Head: Normocephalic and atraumatic.  Eyes:     Extraocular Movements: Extraocular movements intact.     Conjunctiva/sclera: Conjunctivae normal.  Cardiovascular:     Rate and Rhythm: Normal rate and regular rhythm.  Pulmonary:     Effort: Pulmonary effort is normal.     Breath sounds: Normal breath sounds.  Abdominal:     General: Bowel sounds are normal.     Palpations: Abdomen is soft.  Musculoskeletal:        General: No swelling. Normal range of motion.     Cervical back: Normal range of motion and neck supple.  Skin:    General: Skin is warm and dry.     Coloration: Skin is not jaundiced.  Neurological:     General: No focal deficit present.     Mental Status: She is alert and oriented to person, place, and time.  Psychiatric:        Mood and Affect: Mood normal.        Behavior: Behavior normal.      Assessment: *Diarrhea *Nausea *Indeterminate liver lesion *Family history of colon cancer in father and grandfather  Plan: Etiology of patient's GI symptoms unclear. Will schedule for colonoscopy with random colon biopsies to evaluate for underlying ulcerative colitis, Crohn's disease, microscopic colitis, or other as well as screening for polyp/malignancy.  The risks including infection, bleed, or perforation as well as benefits, limitations, alternatives and imponderables have been reviewed with the patient. Questions have been answered. All parties agreeable.  Given her family history, I would recommend that she have a colonoscopy at least every 5 years.  Discussed her liver lesion in depth today.  CT is reassuring as well has her normal LFTs.  Discussed potential MRI with patient for follow-up and she would like to hold off for now.  Thank you 11/02/2020 for the kind referral.  09/09/2020 2:59 PM   Disclaimer:  This note was dictated with voice recognition software. Similar sounding words can inadvertently be transcribed and may not be corrected upon review.

## 2020-10-27 NOTE — Patient Instructions (Signed)
Yvette Patterson  10/27/2020     @PREFPERIOPPHARMACY @   Your procedure is scheduled on   10/30/2020.    Report to 11/01/2020 at  0700  A.M.   Call this number if you have problems the morning of surgery:  [phone]   Remember:  Follow the diet and prep instructions given to you by the office.     Take these medicines the morning of surgery with A SIP OF WATER                                NONE     Do not wear jewelry, make-up or nail polish.  Do not wear lotions, powders, or perfumes, or deodorant.  Do not shave 48 hours prior to surgery.  Men may shave face and neck.  Do not bring valuables to the hospital.  [facility] is not responsible for any belongings or valuables.  Contacts, dentures or bridgework may not be worn into surgery.  Leave your suitcase in the car.  After surgery it may be brought to your room.  For patients admitted to the hospital, discharge time will be determined by your treatment team.  Patients discharged the day of surgery will not be allowed to drive home and must have someone with them for 24 hours.    Special instructions:     DO NOT smoke tobacco or vape for 24 hours before your procedure.  Please read over the following fact sheets that you were given. Anesthesia Post-op Instructions and Care and Recovery After Surgery      Colonoscopy, Adult, Care After This sheet gives you information about how to care for yourself after your procedure. Your health care provider may also give you more specific instructions. If you have problems or questions, contact your health careprovider. What can I expect after the procedure? After the procedure, it is common to have: A small amount of blood in your stool for 24 hours after the procedure. Some gas. Mild cramping or bloating of your abdomen. Follow these instructions at home: Eating and drinking  Drink enough fluid to keep your urine pale yellow. Follow instructions from your  health care provider about eating or drinking restrictions. Resume your normal diet as instructed by your health care provider. Avoid heavy or fried foods that are hard to digest.  Activity Rest as told by your health care provider. Avoid sitting for a long time without moving. Get up to take short walks every 1-2 hours. This is important to improve blood flow and breathing. Ask for help if you feel weak or unsteady. Return to your normal activities as told by your health care provider. Ask your health care provider what activities are safe for you. Managing cramping and bloating  Try walking around when you have cramps or feel bloated. Apply heat to your abdomen as told by your health care provider. Use the heat source that your health care provider recommends, such as a moist heat pack or a heating pad. Place a towel between your skin and the heat source. Leave the heat on for 20-30 minutes. Remove the heat if your skin turns bright red. This is especially important if you are unable to feel pain, heat, or cold. You may have a greater risk of getting burned.  General instructions If you were given a sedative during the procedure, it can affect you for several hours.  Do not drive or operate machinery until your health care provider says that it is safe. For the first 24 hours after the procedure: Do not sign important documents. Do not drink alcohol. Do your regular daily activities at a slower pace than normal. Eat soft foods that are easy to digest. Take over-the-counter and prescription medicines only as told by your health care provider. Keep all follow-up visits as told by your health care provider. This is important. Contact a health care provider if: You have blood in your stool 2-3 days after the procedure. Get help right away if you have: More than a small spotting of blood in your stool. Large blood clots in your stool. Swelling of your abdomen. Nausea or vomiting. A  fever. Increasing pain in your abdomen that is not relieved with medicine. Summary After the procedure, it is common to have a small amount of blood in your stool. You may also have mild cramping and bloating of your abdomen. If you were given a sedative during the procedure, it can affect you for several hours. Do not drive or operate machinery until your health care provider says that it is safe. Get help right away if you have a lot of blood in your stool, nausea or vomiting, a fever, or increased pain in your abdomen. This information is not intended to replace advice given to you by your health care provider. Make sure you discuss any questions you have with your healthcare provider. Document Revised: 03/15/2019 Document Reviewed: 10/15/2018 Elsevier Patient Education  2022 [employer] Monitored Anesthesia Care, Care After This sheet gives you information about how to care for yourself after your procedure. Your health care provider may also give you more specific instructions. If you have problems or questions, contact your health careprovider. What can I expect after the procedure? After the procedure, it is common to have: Tiredness. Forgetfulness about what happened after the procedure. Impaired judgment for important decisions. Nausea or vomiting. Some difficulty with balance. Follow these instructions at home: For the time period you were told by your health care provider:     Rest as needed. Do not participate in activities where you could fall or become injured. Do not drive or use machinery. Do not drink alcohol. Do not take sleeping pills or medicines that cause drowsiness. Do not make important decisions or sign legal documents. Do not take care of children on your own. Eating and drinking Follow the diet that is recommended by your health care provider. Drink enough fluid to keep your urine pale yellow. If you vomit: Drink water, juice, or soup when you can drink  without vomiting. Make sure you have little or no nausea before eating solid foods. General instructions Have a responsible adult stay with you for the time you are told. It is important to have someone help care for you until you are awake and alert. Take over-the-counter and prescription medicines only as told by your health care provider. If you have sleep apnea, surgery and certain medicines can increase your risk for breathing problems. Follow instructions from your health care provider about wearing your sleep device: Anytime you are sleeping, including during daytime naps. While taking prescription pain medicines, sleeping medicines, or medicines that make you drowsy. Avoid smoking. Keep all follow-up visits as told by your health care provider. This is important. Contact a health care provider if: You keep feeling nauseous or you keep vomiting. You feel light-headed. You are still sleepy or having trouble with balance  after 24 hours. You develop a rash. You have a fever. You have redness or swelling around the IV site. Get help right away if: You have trouble breathing. You have new-onset confusion at home. Summary For several hours after your procedure, you may feel tired. You may also be forgetful and have poor judgment. Have a responsible adult stay with you for the time you are told. It is important to have someone help care for you until you are awake and alert. Rest as told. Do not drive or operate machinery. Do not drink alcohol or take sleeping pills. Get help right away if you have trouble breathing, or if you suddenly become confused. This information is not intended to replace advice given to you by your health care provider. Make sure you discuss any questions you have with your healthcare provider. Document Revised: 12/05/2019 Document Reviewed: 02/21/2019 Elsevier Patient Education  2022 [employer].

## 2020-10-28 ENCOUNTER — Encounter (HOSPITAL_COMMUNITY)
Admission: RE | Admit: 2020-10-28 | Discharge: 2020-10-28 | Disposition: A | Discharge status: Home / Self Care | Payer: BC Managed Care – PPO | Source: Ambulatory Visit | Attending: Internal Medicine | Admitting: Internal Medicine

## 2020-10-28 ENCOUNTER — Other Ambulatory Visit: Payer: Self-pay

## 2020-10-28 DIAGNOSIS — Z79899 Other long term (current) drug therapy: Secondary | ICD-10-CM | POA: Diagnosis not present

## 2020-10-28 DIAGNOSIS — Z01812 Encounter for preprocedural laboratory examination: Secondary | ICD-10-CM | POA: Insufficient documentation

## 2020-10-28 DIAGNOSIS — K529 Noninfective gastroenteritis and colitis, unspecified: Secondary | ICD-10-CM | POA: Diagnosis present

## 2020-10-28 DIAGNOSIS — Z888 Allergy status to other drugs, medicaments and biological substances status: Secondary | ICD-10-CM | POA: Diagnosis not present

## 2020-10-28 DIAGNOSIS — K648 Other hemorrhoids: Secondary | ICD-10-CM | POA: Diagnosis not present

## 2020-10-28 LAB — BASIC METABOLIC PANEL
Anion gap: 8 (ref 5–15)
BUN: 13 mg/dL (ref 6–20)
CO2: 26 mmol/L (ref 22–32)
Calcium: 9 mg/dL (ref 8.9–10.3)
Chloride: 100 mmol/L (ref 98–111)
Creatinine, Ser: 0.57 mg/dL (ref 0.44–1.00)
GFR, Estimated: >60 mL/min (ref >60)
Glucose, Bld: 183 mg/dL — ABNORMAL HIGH (ref 70–99)
Potassium: 3.8 mmol/L (ref 3.5–5.1)
Sodium: 134 mmol/L — ABNORMAL LOW (ref 135–145)

## 2020-10-28 LAB — HCG, SERUM, QUALITATIVE: Preg, Serum: NEGATIVE

## 2020-10-28 NOTE — Progress Notes (Signed)
Patient presents to PAT admission testing with BP 158/103.  She has previously stopped her BP medication as instructed by her primary care physician. We contacted [facility] for follow up.  She was instructed to restart her Losartan today by Roma Kayser PA and they will see her in the morning for follow up appointment.

## 2020-10-30 ENCOUNTER — Other Ambulatory Visit: Payer: Self-pay

## 2020-10-30 ENCOUNTER — Encounter (HOSPITAL_COMMUNITY)
Admission: RE | Disposition: A | Discharge status: Home / Self Care | Payer: Self-pay | Source: Home / Self Care | Attending: Internal Medicine

## 2020-10-30 ENCOUNTER — Ambulatory Visit (HOSPITAL_COMMUNITY): Payer: BC Managed Care – PPO | Admitting: Certified Registered Nurse Anesthetist

## 2020-10-30 ENCOUNTER — Ambulatory Visit (HOSPITAL_COMMUNITY)
Admission: RE | Admit: 2020-10-30 | Discharge: 2020-10-30 | Disposition: A | Discharge status: Home / Self Care | Payer: BC Managed Care – PPO | Attending: Internal Medicine | Admitting: Internal Medicine

## 2020-10-30 ENCOUNTER — Encounter (HOSPITAL_COMMUNITY): Payer: Self-pay

## 2020-10-30 DIAGNOSIS — Z79899 Other long term (current) drug therapy: Secondary | ICD-10-CM | POA: Insufficient documentation

## 2020-10-30 DIAGNOSIS — Z888 Allergy status to other drugs, medicaments and biological substances status: Secondary | ICD-10-CM | POA: Insufficient documentation

## 2020-10-30 DIAGNOSIS — K529 Noninfective gastroenteritis and colitis, unspecified: Secondary | ICD-10-CM | POA: Diagnosis not present

## 2020-10-30 DIAGNOSIS — K648 Other hemorrhoids: Secondary | ICD-10-CM | POA: Insufficient documentation

## 2020-10-30 HISTORY — PX: COLONOSCOPY WITH PROPOFOL: SHX5780

## 2020-10-30 HISTORY — PX: BIOPSY: SHX5522

## 2020-10-30 LAB — GLUCOSE, CAPILLARY: Glucose-Capillary: 140 mg/dL — ABNORMAL HIGH (ref 70–99)

## 2020-10-30 SURGERY — COLONOSCOPY WITH PROPOFOL
Anesthesia: General

## 2020-10-30 MED ORDER — PROPOFOL 10 MG/ML IV BOLUS
INTRAVENOUS | Status: DC | PRN
  Administered 2020-10-30: 100 mg via INTRAVENOUS

## 2020-10-30 MED ORDER — LACTATED RINGERS IV SOLN: INTRAVENOUS | Status: DC | PRN

## 2020-10-30 MED ORDER — PROPOFOL 500 MG/50ML IV EMUL
INTRAVENOUS | Status: DC | PRN
  Administered 2020-10-30: 150 ug/kg/min via INTRAVENOUS

## 2020-10-30 NOTE — Anesthesia Postprocedure Evaluation (Signed)
Anesthesia Post Note  Patient: Yvette Patterson  Procedure(s) Performed: COLONOSCOPY WITH PROPOFOL BIOPSY  Patient location during evaluation: Phase II Anesthesia Type: General Level of consciousness: awake Pain management: pain level controlled Vital Signs Assessment: post-procedure vital signs reviewed and stable Respiratory status: spontaneous breathing and respiratory function stable Cardiovascular status: blood pressure returned to baseline and stable Postop Assessment: no headache and no apparent nausea or vomiting Anesthetic complications: no Comments: Late entry   No notable events documented.   Last Vitals:  Vitals:   10/30/20 0744 10/30/20 0848  BP:  (!) 111/53  Pulse: 83 89  Resp: 20 16  Temp: 36.9 C 36.9 C  SpO2: 97% 98%    Last Pain:  Vitals:   10/30/20 0848  TempSrc:   PainSc: 0-No pain                 Windell Norfolk

## 2020-10-30 NOTE — Discharge Instructions (Signed)
  Colonoscopy Discharge Instructions  Read the instructions outlined below and refer to this sheet in the next few weeks. These discharge instructions provide you with general information on caring for yourself after you leave the hospital. Your doctor may also give you specific instructions. While your treatment has been planned according to the most current medical practices available, unavoidable complications occasionally occur.   ACTIVITY You may resume your regular activity, but move at a slower pace for the next 24 hours.  Take frequent rest periods for the next 24 hours.  Walking will help get rid of the air and reduce the bloated feeling in your belly (abdomen).  No driving for 24 hours (because of the medicine (anesthesia) used during the test).   Do not sign any important legal documents or operate any machinery for 24 hours (because of the anesthesia used during the test).  NUTRITION Drink plenty of fluids.  You may resume your normal diet as instructed by your doctor.  Begin with a light meal and progress to your normal diet. Heavy or fried foods are harder to digest and may make you feel sick to your stomach (nauseated).  Avoid alcoholic beverages for 24 hours or as instructed.  MEDICATIONS You may resume your normal medications unless your doctor tells you otherwise.  WHAT YOU CAN EXPECT TODAY Some feelings of bloating in the abdomen.  Passage of more gas than usual.  Spotting of blood in your stool or on the toilet paper.  IF YOU HAD POLYPS REMOVED DURING THE COLONOSCOPY: No aspirin products for 7 days or as instructed.  No alcohol for 7 days or as instructed.  Eat a soft diet for the next 24 hours.  FINDING OUT THE RESULTS OF YOUR TEST Not all test results are available during your visit. If your test results are not back during the visit, make an appointment with your caregiver to find out the results. Do not assume everything is normal if you have not heard from your  caregiver or the medical facility. It is important for you to follow up on all of your test results.  SEEK IMMEDIATE MEDICAL ATTENTION IF: You have more than a spotting of blood in your stool.  Your belly is swollen (abdominal distention).  You are nauseated or vomiting.  You have a temperature over 101.  You have abdominal pain or discomfort that is severe or gets worse throughout the day.   Your colon is very tortuous and redundant.  I was unable to fully complete colonoscopy today.  I did evaluate the vast majority of your colon, but I was unable to reach the beginning portion calledThe cecum and part of the ascending colon.  The portion of the colon I did inspect looked healthy. No polyps or cancer. I took biopsies to rule out a condition called microscopic colitis.  We will call you with these results.  As far as completing your colonoscopy, we can either set you up for a CT colonography or refer you to [facility] for repeat colonoscopy.  Follow-up with GI in 3 months.  I hope you have a great rest of your week!  Hennie Duos. Marletta Lor, D.O. Gastroenterology and Hepatology [facility]

## 2020-10-30 NOTE — H&P (Signed)
Primary Care Physician:  Rosine Door Primary Gastroenterologist:  Dr. Abbey Chatters  Pre-Procedure History & Physical: HPI:  Yvette Patterson is a 45 y.o. female is here for a colonoscopy for diarrhea.  She states she will have episodes of diarrhea which she describes as her body cleansings itself.  States she will have numerous loose bowel movements during this time.  Does not believe these are associated with any types of food though she is unsure.  No abdominal pain.  No melena hematochezia.  Does note occasional nausea as well.  No unintentional weight loss.   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 Authorizing Provider  acetaminophen (TYLENOL) 500 MG tablet Take 1,000 mg by mouth every 6 (six) hours as needed for moderate pain, mild pain, headache or fever.   Yes [provider]  fluticasone (FLONASE) 50 MCG/ACT nasal spray Place 2 sprays into both nostrils daily as needed for allergies. As needed 01/08/16  Yes [provider]  levocetirizine (XYZAL) 2.5 MG/5ML solution Take 2.5 mg by mouth at bedtime as needed for allergies.   Yes [provider]  losartan (COZAAR) 25 MG tablet Take 25 mg by mouth daily.   Yes [provider]  mometasone (ELOCON) 0.1 % ointment Apply 1 application topically at bedtime as needed (Eczema). 05/27/20  Yes [provider]  Na Sulfate-K Sulfate-Mg Sulf (SUPREP BOWEL PREP KIT) 17.5-3.13-1.6 GM/177ML SOLN Take 1 kit by mouth  as directed. 09/09/20  Yes Eloise Harman, DO  naproxen sodium (ALEVE) 220 MG tablet Take 220 mg by mouth daily as needed (pain).   Yes [provider]    Allergies as of 09/09/2020 - Review Complete 09/09/2020  Allergen Reaction Noted   Lisinopril Swelling 05/28/2019    Family History  Problem Relation Age of Onset   Colon cancer Father    Diabetes Father    Stroke Father    Kidney Stones Mother    Colon cancer Paternal Grandfather     Social History   Socioeconomic History   Marital status: Married    Spouse name: Not on file   Number of children: 1   Years of education: Not on file   Highest education level: Not on file  Occupational History   Occupation: school counselor  Tobacco Use   Smoking status: Never   Smokeless tobacco: Never  Vaping Use   Vaping Use: Never used  Substance and Sexual Activity   Alcohol use: Yes    Comment: occ   Drug use: No   Sexual activity: Not on file  Other Topics Concern   Not on file  Social History Narrative   Lives with husband and son   Right Handed   Drinks 2-4 cup caffeine daily   Social Determinants of Health   Financial Resource Strain: Not on file  Food Insecurity: Not on file  Transportation Needs: Not on file  Physical Activity: Not  on file  Stress: Not on file  Social Connections: Not on file  Intimate Partner Violence: Not on file    Review of Systems: See HPI, otherwise negative ROS  Physical Exam: Vital signs in last 24 hours: Temp:  [98.5 F (36.9 C)] 98.5 F (36.9 C) (07/29 0744) Pulse Rate:  [83] 83 (07/29 0744) Resp:  [20] 20 (07/29 0744) SpO2:  [97 %] 97 % (07/29 0744)   General:   Alert,  Well-developed, well-nourished, pleasant and cooperative in NAD Head:  Normocephalic and atraumatic. Eyes:  Sclera clear, no icterus.   Conjunctiva pink. Ears:  Normal auditory acuity. Nose:  No deformity, discharge,  or lesions. Mouth:  No deformity or lesions, dentition normal. Neck:  Supple;  no masses or thyromegaly. Lungs:  Clear throughout to auscultation.   No wheezes, crackles, or rhonchi. No acute distress. Heart:  Regular rate and rhythm; no murmurs, clicks, rubs,  or gallops. Abdomen:  Soft, nontender and nondistended. No masses, hepatosplenomegaly or hernias noted. Normal bowel sounds, without guarding, and without rebound.   Msk:  Symmetrical without gross deformities. Normal posture. Extremities:  Without clubbing or edema. Neurologic:  Alert and  oriented x4;  grossly normal neurologically. Skin:  Intact without significant lesions or rashes. Cervical Nodes:  No significant cervical adenopathy. Psych:  Alert and cooperative. Normal mood and affect.  Impression/Plan: Yvette Patterson is here for a colonoscopy to be performed for diarrhea The risks of the procedure including infection, bleed, or perforation as well as benefits, limitations, alternatives and imponderables have been reviewed with the patient. Questions have been answered. All parties agreeable.

## 2020-10-30 NOTE — Op Note (Signed)
[facility] Patient Name: Yvette Patterson Procedure Date: 10/30/2020 7:55 AM MRN: [id number] Date of Birth: [birth year] Attending MD: Elon Alas. Abbey Chatters DO CSN: [id number] Age: 45 Admit Type: Outpatient Procedure:                Colonoscopy Indications:              Chronic diarrhea Providers:                Elon Alas. Abbey Chatters, DO, Gwenlyn Fudge, RN, Aram Candela Referring MD:              Medicines:                See the Anesthesia note for documentation of the                            administered medications Complications:            No immediate complications. Estimated Blood Loss:     Estimated blood loss was minimal. Procedure:                Pre-Anesthesia Assessment:                           - The anesthesia plan was to use monitored                            anesthesia care (MAC).                           After obtaining informed consent, the colonoscope                            was passed under direct vision. Throughout the                            procedure, the patient's blood pressure, pulse, and                            oxygen saturations were monitored continuously. The                            [id number]-HQ190L ([id number]) scope was introduced through                            the anus with the intention of advancing to the                            cecum. The scope was advanced to the ascending                            colon before the procedure was aborted. Medications                            were given. The colonoscopy was technically  difficult and complex due to a redundant colon,                            significant looping and a tortuous colon.                            Successful completion of the procedure was aided by                            changing the patient to a supine position and                            applying abdominal pressure. The patient tolerated                             the procedure well. The quality of the bowel                            preparation was evaluated using the BBPS [facility]                            Bowel Preparation Scale) with scores of: Right                            Colon = 3, Transverse Colon = 3 and Left Colon = 3                            (entire mucosa seen well with no residual staining,                            small fragments of stool or opaque liquid). The                            total BBPS score equals 9. Scope In: 8:10:02 AM Scope Out: 8:45:22 AM Total Procedure Duration: 0 hours 35 minutes 20 seconds  Findings:      The perianal and digital rectal examinations were normal.      Non-bleeding internal hemorrhoids were found during endoscopy.      Patient's colon significantly redundant causing major looping of the       scope. I was able to reach the ascending colon but unable to intubate       the cecum itself despite placing patient supine, abdominal pressure by       staff. Pediatric scope was then replaced with adult colonoscope. I again       reached the ascending colon and was unable to intubate cecum despite       abdominal pressure. Scope withdrawal then commenced. Biopsies for       histology were taken with a cold forceps from the ascending colon,       transverse colon and descending colon for evaluation of microscopic       colitis. Impression:               - Non-bleeding internal hemorrhoids. Moderate Sedation:      Per Anesthesia Care Recommendation:           -  Patient has a contact number available for                            emergencies. The signs and symptoms of potential                            delayed complications were discussed with the                            patient. Return to normal activities tomorrow.                            Written discharge instructions were provided to the                            patient.                           - Resume previous diet.                            - Continue present medications.                           - Await pathology results.                           - Full exam of patient's cecum and part of her                            ascending colon unable to be performed on today's                            exam. Can consider CT colonography to evaluate this                            area or referral to [facility] for repeat                            colonoscopy. We will discuss further with patient.                           - Return to GI clinic in 3 months. Procedure Code(s):        --- Professional ---                           [phone], 7, Colonoscopy, flexible; with biopsy,                            single or multiple Diagnosis Code(s):        --- Professional ---                           K64.8, Other hemorrhoids  K52.9, Noninfective gastroenteritis and colitis,                            unspecified CPT copyright 2019 American Medical Association. All rights reserved. The codes documented in this report are preliminary and upon coder review may  be revised to meet current compliance requirements. Elon Alas. Abbey Chatters, DO [place] Abbey Chatters, DO 10/30/2020 8:54:46 AM This report has been signed electronically. Number of Addenda: 0

## 2020-10-30 NOTE — Anesthesia Preprocedure Evaluation (Signed)
Anesthesia Evaluation  Patient identified by MRN, date of birth, ID band Patient awake    Reviewed: Allergy & Precautions, H&P , NPO status , Patient's Chart, lab work & pertinent test results, reviewed documented beta blocker date and time   Airway Mallampati: II  TM Distance: >3 FB Neck ROM: full    Dental no notable dental hx.    Pulmonary neg pulmonary ROS,    Pulmonary exam normal breath sounds clear to auscultation       Cardiovascular Exercise Tolerance: Good hypertension, negative cardio ROS   Rhythm:regular Rate:Normal     Neuro/Psych PSYCHIATRIC DISORDERS Anxiety  Neuromuscular disease    GI/Hepatic negative GI ROS, Neg liver ROS,   Endo/Other  negative endocrine ROSdiabetes  Renal/GU negative Renal ROS  negative genitourinary   Musculoskeletal   Abdominal   Peds  Hematology negative hematology ROS (+)   Anesthesia Other Findings   Reproductive/Obstetrics negative OB ROS                             Anesthesia Physical Anesthesia Plan  ASA: 2  Anesthesia Plan: General   Post-op Pain Management:    Induction:   PONV Risk Score and Plan: Propofol infusion  Airway Management Planned:   Additional Equipment:   Intra-op Plan:   Post-operative Plan:   Informed Consent: I have reviewed the patients History and Physical, chart, labs and discussed the procedure including the risks, benefits and alternatives for the proposed anesthesia with the patient or authorized representative who has indicated his/her understanding and acceptance.     Dental Advisory Given  Plan Discussed with: CRNA  Anesthesia Plan Comments:         Anesthesia Quick Evaluation

## 2020-10-30 NOTE — Transfer of Care (Signed)
Immediate Anesthesia Transfer of Care Note  Patient: Yvette Patterson  Procedure(s) Performed: COLONOSCOPY WITH PROPOFOL BIOPSY  Patient Location: Short Stay  Anesthesia Type:General  Level of Consciousness: awake, alert  and oriented  Airway & Oxygen Therapy: Patient Spontanous Breathing  Post-op Assessment: Report given to RN and Post -op Vital signs reviewed and stable  Post vital signs: Reviewed and stable  Last Vitals:  Vitals Value Taken Time  BP 111/53 10/30/20 0848  Temp 36.9 C 10/30/20 0848  Pulse 89 10/30/20 0848  Resp 16 10/30/20 0848  SpO2 98 % 10/30/20 0848    Last Pain:  Vitals:   10/30/20 0848  TempSrc:   PainSc: 0-No pain      Patients Stated Pain Goal: 6 (10/30/20 0726)  Complications: No notable events documented.

## 2020-11-02 LAB — SURGICAL PATHOLOGY

## 2020-11-03 ENCOUNTER — Encounter (HOSPITAL_COMMUNITY): Payer: Self-pay | Admitting: Internal Medicine

## 2021-02-03 ENCOUNTER — Encounter: Payer: Self-pay | Admitting: Cardiology

## 2021-02-24 ENCOUNTER — Ambulatory Visit: Payer: BC Managed Care – PPO | Admitting: Gastroenterology

## 2021-02-24 ENCOUNTER — Telehealth: Payer: Self-pay | Admitting: Gastroenterology

## 2021-02-24 ENCOUNTER — Other Ambulatory Visit: Payer: Self-pay

## 2021-02-24 ENCOUNTER — Encounter: Payer: Self-pay | Admitting: Gastroenterology

## 2021-02-24 VITALS — BP 161/87 | HR 86 | Temp 97.5°F | Ht 67.0 in | Wt 303.0 lb

## 2021-02-24 DIAGNOSIS — K769 Liver disease, unspecified: Secondary | ICD-10-CM | POA: Diagnosis not present

## 2021-02-24 DIAGNOSIS — Z800 Family history of malignant neoplasm of digestive organs: Secondary | ICD-10-CM

## 2021-02-24 DIAGNOSIS — R197 Diarrhea, unspecified: Secondary | ICD-10-CM

## 2021-02-24 NOTE — Patient Instructions (Signed)
We will be in touch with plan for visualization in the remaining part of your colon not seen at time of colonoscopy.  Monitor your recurrent episodes of diarrhea. Make note if episodes occur after dairy products, greasy foods, gluten containing foods. For bloating, consider avoiding foods that are consider high FODMAP foods. These tend to cause more GI distress. See list below.   Low-FODMAP Eating Plan FODMAP stands for fermentable oligosaccharides, disaccharides, monosaccharides, and polyols. These are sugars that are hard for some people to digest. A low-FODMAP eating plan may help some people who have irritable bowel syndrome (IBS) and certain other bowel (intestinal) diseases to manage their symptoms. This meal plan can be complicated to follow. Work with a diet and nutrition specialist (dietitian) to make a low-FODMAP eating plan that is right for you. A dietitian can help make sure that you get enough nutrition from this diet. What are tips for following this plan? Reading food labels Check labels for hidden FODMAPs such as: High-fructose syrup. Honey. Agave. Natural fruit flavors. Onion or garlic powder. Choose low-FODMAP foods that contain 3-4 grams of fiber per serving. Check food labels for serving sizes. Eat only one serving at a time to make sure FODMAP levels stay low. Shopping Shop with a list of foods that are recommended on this diet and make a meal plan. Meal planning Follow a low-FODMAP eating plan for up to 6 weeks, or as told by your health care provider or dietitian. To follow the eating plan: Eliminate high-FODMAP foods from your diet completely. Choose only low-FODMAP foods to eat. You will do this for 2-6 weeks. Gradually reintroduce high-FODMAP foods into your diet one at a time. Most people should wait a few days before introducing the next new high-FODMAP food into their meal plan. Your dietitian can recommend how quickly you may reintroduce foods. Keep a daily  record of what and how much you eat and drink. Make note of any symptoms that you have after eating. Review your daily record with a dietitian regularly to identify which foods you can eat and which foods you should avoid. General tips Drink enough fluid each day to keep your urine pale yellow. Avoid processed foods. These often have added sugar and may be high in FODMAPs. Avoid most dairy products, whole grains, and sweeteners. Work with a dietitian to make sure you get enough fiber in your diet. Avoid high FODMAP foods at meals to manage symptoms. Recommended foods Fruits Bananas, oranges, tangerines, lemons, limes, blueberries, raspberries, strawberries, grapes, cantaloupe, honeydew melon, kiwi, papaya, passion fruit, and pineapple. Limited amounts of dried cranberries, banana chips, and shredded coconut. Vegetables Eggplant, zucchini, cucumber, peppers, green beans, bean sprouts, lettuce, arugula, kale, Swiss chard, spinach, collard greens, bok choy, summer squash, potato, and tomato. Limited amounts of corn, carrot, and sweet potato. Green parts of scallions. Grains Gluten-free grains, such as rice, oats, buckwheat, quinoa, corn, polenta, and millet. Gluten-free pasta, bread, or cereal. Rice noodles. Corn tortillas. Meats and other proteins Unseasoned beef, pork, poultry, or fish. Eggs. Yvette Patterson. Tofu (firm) and tempeh. Limited amounts of nuts and seeds, such as almonds, walnuts, [place] nuts, pecans, peanuts, nut butters, pumpkin seeds, chia seeds, and sunflower seeds. Dairy Lactose-free milk, yogurt, and kefir. Lactose-free cottage cheese and ice cream. Non-dairy milks, such as almond, coconut, hemp, and rice milk. Non-dairy yogurt. Limited amounts of goat cheese, brie, mozzarella, parmesan, swiss, and other hard cheeses. Fats and oils Butter-free spreads. Vegetable oils, such as olive, canola, and sunflower oil. Seasoning and  other foods Artificial sweeteners with names that do not end in  "ol," such as aspartame, saccharine, and stevia. Maple syrup, white table sugar, raw sugar, brown sugar, and molasses. Mayonnaise, soy sauce, and tamari. Fresh basil, coriander, parsley, rosemary, and thyme. Beverages Water and mineral water. Sugar-sweetened soft drinks. Small amounts of orange juice or cranberry juice. Black and green tea. Most dry wines. Coffee. The items listed above may not be a complete list of foods and beverages you can eat. Contact a dietitian for more information. Foods to avoid Fruits Fresh, dried, and juiced forms of apple, pear, watermelon, peach, plum, cherries, apricots, blackberries, boysenberries, figs, nectarines, and mango. Avocado. Vegetables Chicory root, artichoke, asparagus, cabbage, snow peas, Brussels sprouts, broccoli, sugar snap peas, mushrooms, celery, and cauliflower. Onions, garlic, leeks, and the white part of scallions. Grains Wheat, including kamut, durum, and semolina. Barley and bulgur. Couscous. Wheat-based cereals. Wheat noodles, bread, crackers, and pastries. Meats and other proteins Fried or fatty meat. Sausage. Cashews and pistachios. Soybeans, baked beans, black beans, chickpeas, kidney beans, fava beans, navy beans, lentils, black-eyed peas, and split peas. Dairy Milk, yogurt, ice cream, and soft cheese. Cream and sour cream. Milk-based sauces. Custard. Buttermilk. Soy milk. Seasoning and other foods Any sugar-free gum or candy. Foods that contain artificial sweeteners such as sorbitol, mannitol, isomalt, or xylitol. Foods that contain honey, high-fructose corn syrup, or agave. Bouillon, vegetable stock, beef stock, and chicken stock. Garlic and onion powder. Condiments made with onion, such as hummus, chutney, pickles, relish, salad dressing, and salsa. Tomato paste. Beverages Chicory-based drinks. Coffee substitutes. Chamomile tea. Fennel tea. Sweet or fortified wines such as port or sherry. Diet soft drinks made with isomalt, mannitol,  maltitol, sorbitol, or xylitol. Apple, pear, and mango juice. Juices with high-fructose corn syrup. The items listed above may not be a complete list of foods and beverages you should avoid. Contact a dietitian for more information. Summary FODMAP stands for fermentable oligosaccharides, disaccharides, monosaccharides, and polyols. These are sugars that are hard for some people to digest. A low-FODMAP eating plan is a short-term diet that helps to ease symptoms of certain bowel diseases. The eating plan usually lasts up to 6 weeks. After that, high-FODMAP foods are reintroduced gradually and one at a time. This can help you find out which foods may be causing symptoms. A low-FODMAP eating plan can be complicated. It is best to work with a dietitian who has experience with this type of plan. This information is not intended to replace advice given to you by your health care provider. Make sure you discuss any questions you have with your health care provider. Document Revised: 08/08/2019 Document Reviewed: 08/08/2019 Elsevier Patient Education  2022 [employer].

## 2021-02-24 NOTE — Progress Notes (Signed)
Primary Care Physician: Rosine Door  Primary Gastroenterologist:  Elon Alas. Abbey Chatters, DO   Chief Complaint  Patient presents with   Follow-up    Pp fu. Per report unable to complete TCS completely.    HPI: Yvette Patterson is a 45 y.o. female here for follow up. Seen in the office back in 09/2020.   She has h/o episodic diarrhea occurring about every 6 weeks. Symptoms associated with nausea. Will have numerous stools starting out solid and ending up like "hot lava". Occurring for a couple of years. Two episodes occurred after eating out at [employer]. Typically occurs in evening after dinner. Denies constipation. No melena, brbpr. Hasn't had an episode since colonoscopy.   She had attempted colonoscopy in 10/2020. Colonoscopy only completed to the ascending colon due to redundant colon and major looping. She had non-bleeding internal hemorrhoids. Random colon biopsies were negative.    Patient has history of liver lesion initially seen on u/s 02/2020. Described as 2.6 cm lesion in the left liver lobe, hypoechoic but solid. CT Abd with contrast 03/2020 did not reveal liver lesion. Radiologist suggested hepatic protocol MRI in 3 months.   Current Outpatient Medications  Medication Sig Dispense Refill   acetaminophen (TYLENOL) 500 MG tablet Take 1,000 mg by mouth every 6 (six) hours as needed for moderate pain, mild pain, headache or fever.     fluticasone (FLONASE) 50 MCG/ACT nasal spray Place 2 sprays into both nostrils daily as needed for allergies. As needed     levocetirizine (XYZAL) 2.5 MG/5ML solution Take 2.5 mg by mouth at bedtime as needed for allergies.     losartan (COZAAR) 25 MG tablet Take 100 mg by mouth daily.     mometasone (ELOCON) 0.1 % ointment Apply 1 application topically at bedtime as needed (Eczema).     naproxen sodium (ALEVE) 220 MG tablet Take 220 mg by mouth daily as needed (pain).     No current facility-administered medications for  this visit.    Allergies as of 02/24/2021 - Review Complete 02/24/2021  Allergen Reaction Noted   Lisinopril Swelling 05/28/2019    ROS:  General: Negative for anorexia, weight loss, fever, chills, fatigue, weakness. ENT: Negative for hoarseness, difficulty swallowing , nasal congestion. CV: Negative for chest pain, angina, palpitations, dyspnea on exertion, peripheral edema.  Respiratory: Negative for dyspnea at rest, dyspnea on exertion, cough, sputum, wheezing.  GI: See history of present illness. GU:  Negative for dysuria, hematuria, urinary incontinence, urinary frequency, nocturnal urination.  Endo: Negative for unusual weight change.    Physical Examination:   BP (!) 161/87   Pulse 86   Temp (!) 97.5 F (36.4 C)   Ht 5\' 7"  (1.702 m)   Wt (!) 303 lb (137.4 kg)   LMP 02/24/2021   BMI 47.46 kg/m   General: Well-nourished, well-developed in no acute distress.  Eyes: No icterus. Mouth: masked Abdomen: Bowel sounds are normal, nontender, nondistended, no hepatosplenomegaly or masses, no abdominal bruits or hernia , no rebound or guarding.   Extremities: No lower extremity edema. No clubbing or deformities. Neuro: Alert and oriented x 4   Skin: Warm and dry, no jaundice.   Psych: Alert and cooperative, normal mood and affect.  Assessment:  Episodic diarrhea and nausea: noted over two years, occurs about every 6 weeks. Denies constipation. Possibly food related/intolerances, occurring couple of times after eating at [employer] ?IBS. She also has more frequent postprandial bloating, especially if overeats.  Indeterminate liver lesion: would consider further evaluation of MRI liver if not see on CT colonography.   Incomplete colonoscopy: due to redundancy and looping. FH of colon cancer in father and ?grandfather. To discuss with Dr. Marletta Lor regarding recommendations for completing examination of the colon either by CT colonography or referral to [facility] to complete  colonoscopy.    Plan: Repeat colonoscopy 5 years for FH of colon cancer (discussed with Dr. Marletta Lor, would attempt locally again if patient willing).  Discussed with Dr. Marletta Lor after patient left the office, he advises to pursue CT colonography now to further evaluate the right colon not seen at time of colonoscopy.   May potentially still need MRI liver to complete work up of liver lesion, will await CT colonography results.  Monitor recurrent episodes of diarrhea, making note of any particular foods eating prior to onset.  For bloating, avoid high FODMAP foods.

## 2021-02-24 NOTE — Telephone Encounter (Signed)
Please let patient know that per Dr. Marletta Lor, he advises CT colonography to evaluate the right colon that could not be seen at time of colonoscopy.   Please arrange.

## 2021-03-01 NOTE — Telephone Encounter (Signed)
Lmom for pt to return my call.  

## 2021-03-02 NOTE — Addendum Note (Signed)
Addended by: Armstead Peaks on: 03/02/2021 01:22 PM   Modules accepted: Orders

## 2021-03-02 NOTE — Telephone Encounter (Signed)
PA approved via AIM for CT colonoscopy. Auth# [id number] DOS 03/02/2021-03/31/2021  Called [facility] and spoke with [place]. She stated they are currently not doing CT Colonoscopy as it has been a recall on the prep for this and unsure when they will be scheduling these again. Prep has been on recall for about 3-4 months now.   I called [facility] and spoke with CT and was advised they do not do CT colonoscopy, only [facility]  Please advise Verlon Au thanks

## 2021-03-02 NOTE — Telephone Encounter (Signed)
Ok

## 2021-03-02 NOTE — Telephone Encounter (Signed)
Pt was made aware and is willing to move forward with having it done.

## 2021-03-04 NOTE — Telephone Encounter (Signed)
Tried to call patient. LMOAM for return call. I will talk to her when she calls if I'm in the office.   Will need to discuss options of waiting (for undisclosed amount of time) for CT colonography for when its available vs going to [facility] to complete colonoscopy.   If she does not get CT, then we will also need to arrange for MRI liver with and without contrast to evaluate liver lesion seen last year on u/s (to further detail and make sure stable).

## 2021-03-22 NOTE — Telephone Encounter (Signed)
Lmom for pt to return my call.  

## 2021-03-22 NOTE — Telephone Encounter (Signed)
Patient never returned call. I saw her at a school event and gave her heads up about the issue and requested she call in.   -->Tammy, can you please let patient know that I discussed with Dr. Marletta Lor, and at this time since we do not know when CT virtual colonoscopy can be done, would recommend pursuing colonoscopy at [facility] to complete her exam. This is important, especially given her family history of colon cancer in two relatives.   -->She also needs MRI liver with and without contrast to follow up on indeterminate liver lesion seen on prior u/s in 02/2020. She weighs 303 pounds. I suspect [facility] MRI would be best option, as opposed to [facility] where she went before.

## 2021-03-23 ENCOUNTER — Telehealth: Payer: Self-pay | Admitting: Internal Medicine

## 2021-03-23 NOTE — Telephone Encounter (Signed)
See other note

## 2021-03-23 NOTE — Telephone Encounter (Signed)
Pt returning call. [phone]

## 2021-03-23 NOTE — Telephone Encounter (Signed)
Lmom for pt to return my call.  

## 2021-03-23 NOTE — Telephone Encounter (Signed)
[facility] Clinical:  Please arrange for colonoscopy at [facility] as outlined below.   Please arrange for MRI liver as outlined below.

## 2021-03-23 NOTE — Telephone Encounter (Signed)
Pt was made aware and pt is agreeable with moving forward with colonoscopy at [facility] and also MRI at [facility].

## 2021-03-24 ENCOUNTER — Encounter: Payer: Self-pay | Admitting: *Deleted

## 2021-03-24 NOTE — Addendum Note (Signed)
Addended by: Corrie Mckusick on: 03/24/2021 09:50 AM   Modules accepted: Orders

## 2021-03-24 NOTE — Telephone Encounter (Signed)
PA for MRI liver submitted via AIM website. Order ID: [id number], valid 03/24/21-04/22/21.

## 2021-03-24 NOTE — Telephone Encounter (Signed)
MRI liver scheduled for 04/02/21 at 3:00pm, arrive at 2:30pm. NPO 4 hours prior to test.  Called and informed pt of MRI liver appt. Advised her referral will be sent to [facility] for TCS. Letter mailed.  Order for TCS and clinical notes faxed to [facility].

## 2021-03-30 ENCOUNTER — Encounter: Payer: Self-pay | Admitting: Cardiology

## 2021-03-30 ENCOUNTER — Ambulatory Visit: Payer: BC Managed Care – PPO | Admitting: Cardiology

## 2021-03-30 ENCOUNTER — Encounter: Payer: Self-pay | Admitting: *Deleted

## 2021-03-30 ENCOUNTER — Other Ambulatory Visit: Payer: Self-pay

## 2021-03-30 VITALS — BP 142/102 | HR 76 | Ht 67.0 in | Wt 299.0 lb

## 2021-03-30 DIAGNOSIS — R0789 Other chest pain: Secondary | ICD-10-CM

## 2021-03-30 DIAGNOSIS — R0602 Shortness of breath: Secondary | ICD-10-CM | POA: Diagnosis not present

## 2021-03-30 DIAGNOSIS — G4733 Obstructive sleep apnea (adult) (pediatric): Secondary | ICD-10-CM | POA: Diagnosis not present

## 2021-03-30 DIAGNOSIS — I10 Essential (primary) hypertension: Secondary | ICD-10-CM | POA: Diagnosis not present

## 2021-03-30 MED ORDER — CHLORTHALIDONE 25 MG PO TABS: 12.5000 mg | ORAL_TABLET | Freq: Every day | ORAL | 6 refills | Status: DC

## 2021-03-30 NOTE — Patient Instructions (Addendum)
Medication Instructions:  Begin Chlorthalidone 12.5mg  daily  Continue all other medications.     Labwork: BMET, Mg - orders given today.  Please do in 2 weeks (around 04/13/2021)  Testing/Procedures: Your physician has requested that you have an echocardiogram. Echocardiography is a painless test that uses sound waves to create images of your heart. It provides your doctor with information about the size and shape of your heart and how well your hearts chambers and valves are working. This procedure takes approximately one hour. There are no restrictions for this procedure.  Follow-Up: Office will contact with results via phone or letter.    3 months   Any Other Special Instructions Will Be Listed Below (If Applicable). DASH diet info given today.  You have been referred to:  Pulmonology Please call the office in 2 weeks with update on blood pressure readings.   If you need a refill on your cardiac medications before your next appointment, please call your pharmacy.

## 2021-03-30 NOTE — Progress Notes (Signed)
Clinical Summary Yvette Patterson is a 45 y.o.female seen as a new consult, referred by PA [facility] for the following medical problems.  Chest pain - sporadic symptoms - sharp pain typically, rare pressure. Mid to left chest. Lasts just a few seconds. No other associated symptoms. Could happen at rest or with activity. Occurs about once every 2 months - highest level of activity is doing housework, some fatigue with activities but no severe SOB. - some DOE with high levels of activity above her normal range.  - some recent LE edema. Ongoing for a few years. She is on lasix 20mg  daily, takes as needed based on swelling.      2. Palpitations - 3-4 episodes over the last year - unclear if specific pattern, one did happen after a nightmare.  - tea x 1 cup in AM, no coffee, rare sodas, no energy drinks, occasional EtoH    2. LE edema - has been on lasix by pcp   3. HTN - compliant with meds   4. OSA screen - +snoring, daytime somnolence.  - neuro had discussed testing, she was to get back   6. Ezema - on prednisone currently, 5 day course.     and Yvette Patterson are her parents. Father passed away few years ago.  81 yo son.  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     fluticasone (FLONASE) 50 MCG/ACT nasal spray Place 2 sprays into both nostrils daily as needed for allergies. As needed     furosemide (LASIX) 20 MG tablet Take 20 mg by mouth daily.      levocetirizine (XYZAL) 2.5 MG/5ML solution Take 2.5 mg by mouth at bedtime as needed for allergies.     losartan (COZAAR) 100 MG tablet Take 100 mg by mouth daily.     losartan (COZAAR) 25 MG tablet Take 100 mg by mouth daily.     metFORMIN (GLUCOPHAGE-XR) 500 MG 24 hr tablet SMARTSIG:1 Tablet(s) By Mouth Every Evening     mometasone (ELOCON) 0.1 % ointment Apply 1 application topically at bedtime as needed (Eczema).     naproxen sodium (ALEVE) 220 MG tablet Take 220 mg by mouth daily as needed (pain).     No current facility-administered medications for this visit.     Past Surgical History:  Procedure Laterality Date   BIOPSY  10/30/2020   Procedure: BIOPSY;  Surgeon: 11/01/2020, DO;  Location: [facility];  Service: Endoscopy;;   COLONOSCOPY WITH PROPOFOL N/A 10/30/2020   Procedure: COLONOSCOPY WITH PROPOFOL;  Surgeon: 11/01/2020, DO;  Location: [facility];  Service: Endoscopy;  Laterality: N/A;  8:30am   HYSTEROSCOPY     KNEE SURGERY     OVARIAN CYST REMOVAL       Allergies  Allergen Reactions   Lisinopril Swelling    Facial swelling  Family History  Problem Relation Age of Onset   Colon cancer Father    Diabetes Father    Stroke Father    Kidney Stones Mother    Colon cancer Paternal Grandfather      Social History Yvette Patterson reports that she has never smoked. She has never used smokeless tobacco. Yvette Patterson reports current alcohol use.   Review of Systems CONSTITUTIONAL: No weight loss, fever, chills, weakness or fatigue.  HEENT: Eyes: No visual loss, blurred vision, double vision or yellow sclerae.No hearing loss, sneezing, congestion, runny nose or sore throat.  SKIN: No rash or itching.  CARDIOVASCULAR: per hpi RESPIRATORY: per hpi GASTROINTESTINAL: No anorexia, nausea, vomiting or diarrhea. No abdominal pain or blood.  GENITOURINARY: No burning on urination, no polyuria NEUROLOGICAL: No headache, dizziness, syncope, paralysis,  ataxia, numbness or tingling in the extremities. No change in bowel or bladder control.  MUSCULOSKELETAL: No muscle, back pain, joint pain or stiffness.  LYMPHATICS: No enlarged nodes. No history of splenectomy.  PSYCHIATRIC: No history of depression or anxiety.  ENDOCRINOLOGIC: No reports of sweating, cold or heat intolerance. No polyuria or polydipsia.  Marland Kitchen   Physical Examination Today's Vitals   03/30/21 1043  BP: (!) 142/102  Pulse: 76  SpO2: 98%  Weight: 299 lb (135.6 kg)  Height: 5\' 7"  (1.702 m)   Body mass index is 46.83 kg/m.  Gen: resting comfortably, no acute distress HEENT: no scleral icterus, pupils equal round and reactive, no palptable cervical adenopathy,  CV: RRR, no m/r/g no jvd Resp: Clear to auscultation bilaterally GI: abdomen is soft, non-tender, non-distended, normal bowel sounds, no hepatosplenomegaly MSK: extremities are warm, no edema.  Skin: warm, no rash Neuro:  no focal deficits Psych: appropriate affect      Assessment and Plan  1.Chest pain - atypical symptoms, would not plan for ischemic testing at this time  2. SOB/LE edema - may be secondary to deconditioning, but with LE edema cannot exclude some cardiac dysfunction - obtain echo  3. HTN - above goal, start chlortahlidone 12.5mg  daily. Check bmet/mg in 2 weeks, she will update with bp's in 2 weeks  4. OSA screen - signs and symptoms of OSA, will refer to pulmonary  F/u 3 months      [place], M.D.

## 2021-04-02 ENCOUNTER — Other Ambulatory Visit: Payer: Self-pay

## 2021-04-02 ENCOUNTER — Ambulatory Visit (HOSPITAL_COMMUNITY)
Admission: RE | Admit: 2021-04-02 | Discharge: 2021-04-02 | Disposition: A | Discharge status: Home / Self Care | Payer: BC Managed Care – PPO | Source: Ambulatory Visit | Attending: Gastroenterology | Admitting: Gastroenterology

## 2021-04-02 DIAGNOSIS — K769 Liver disease, unspecified: Secondary | ICD-10-CM | POA: Diagnosis not present

## 2021-04-02 IMAGING — MR MR ABDOMEN WO/W CM
20 series · 48 of 48 positions shown · IV contrast (gadavist)
Comparison: U/S abdomen complete [DATE] and CT abdomen
[DATE]

CLINICAL DATA: Evaluate indeterminate liver lesion identified on
ultrasound.

EXAM:
MRI ABDOMEN WITHOUT AND WITH CONTRAST
TECHNIQUE: Multiplanar multisequence MR imaging of the abdomen was performed
both before and after the administration of intravenous contrast.
CONTRAST:  10mL GADAVIST GADOBUTROL 1 MMOL/ML IV SOLN

[Series 3: cor haste · coronal · 6.0mm · 1.41mm/px · 2 of 34 slices shown]
[im 1/34]
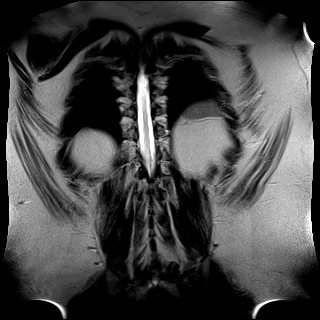
[im 34/34]
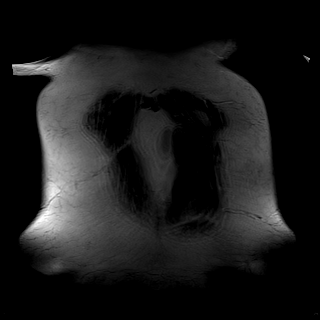

[Series 6: T2 fat-sat · axial · 6.0mm · 1.41mm/px · z∈[-137,+99]mm · 2 of 34 slices shown]
[im 1/34]
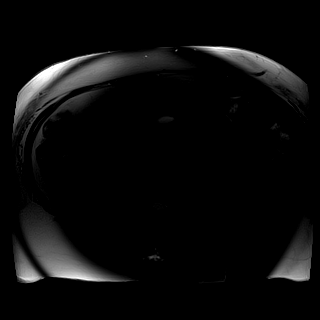
[im 34/34]
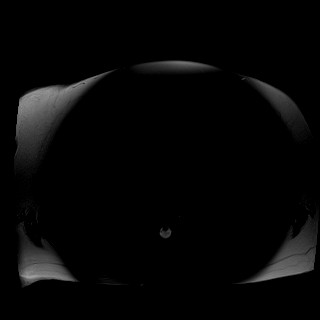

[Series 7: DWI · axial · 6.0mm · 1.68mm/px · z∈[-123,+85]mm · 2 of 30 slices shown (1 of 4)]
[im 1/30]
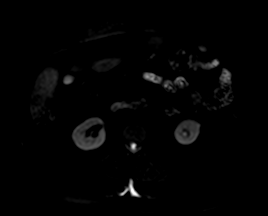
[im 30/30]
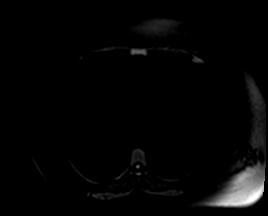

[Series 7: DWI · axial · 6.0mm · 1.68mm/px · 1 of 30 slices shown (2 of 4)]
[im 1/30]
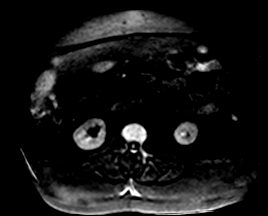

[Series 7: DWI · axial · 6.0mm · 1.68mm/px · 1 of 30 slices shown (3 of 4)]
[im 1/30]
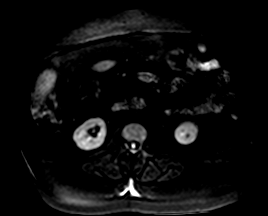

[Series 8: DWI · axial · 6.0mm · 1.68mm/px · 1 of 30 slices shown (4 of 4)]
[im 1/30]
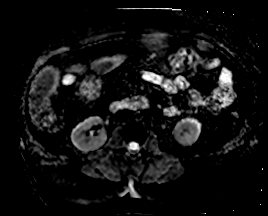

[Series 9: ax haste bh · axial · 6.0mm · 1.41mm/px · 1 of 34 slices shown]
[im 1/34]
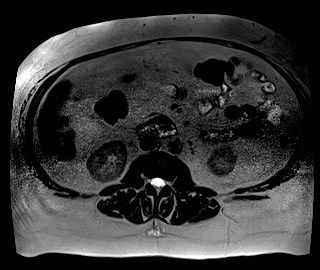

[Series 10: ax in and · axial · 3.0mm · 1.41mm/px · z∈[-147,+88]mm · 3 of 80 slices shown (1 of 2)]
[im 1/80]
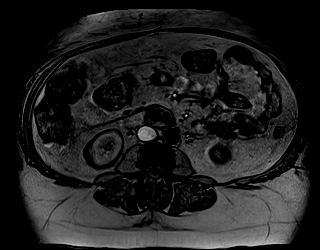
[im 40/80]
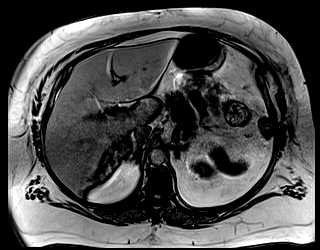
[im 80/80]
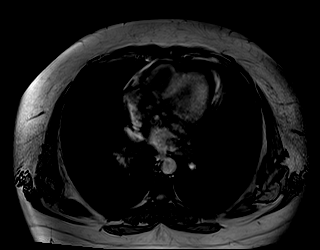

[Series 11: ax in and · axial · 3.0mm · 1.41mm/px · z∈[-147,+88]mm · 3 of 80 slices shown (2 of 2)]
[im 1/80]
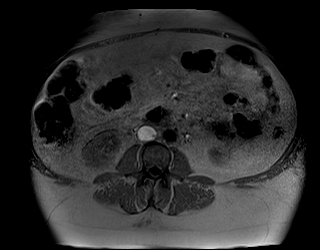
[im 40/80]
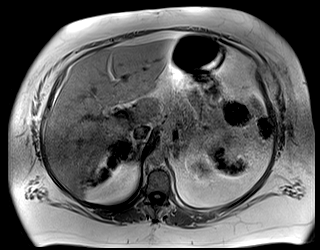
[im 80/80]
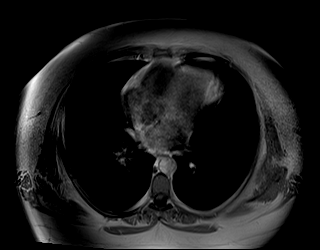

[Series 12: bSSFP · axial · 6.0mm · 0.88mm/px · z∈[-159,+121]mm · 2 of 48 slices shown]
[im 1/48]
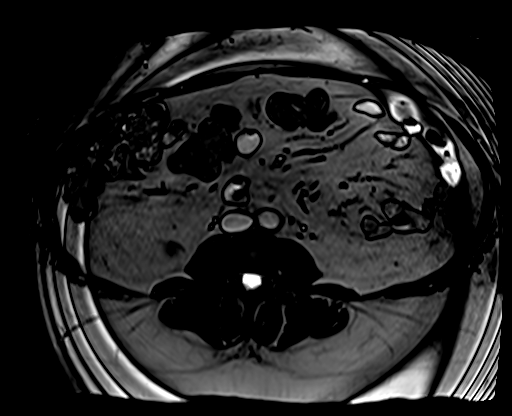
[im 48/48]
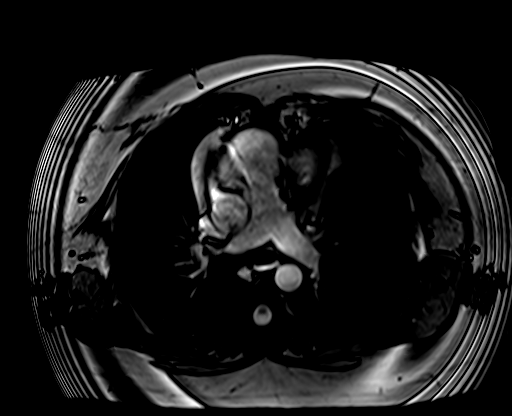

[Series 13: T1 dynamic · axial · 3.0mm · 1.41mm/px · z∈[-147,+88]mm · 3 of 80 slices shown (1 of 9)]
[im 1/80]
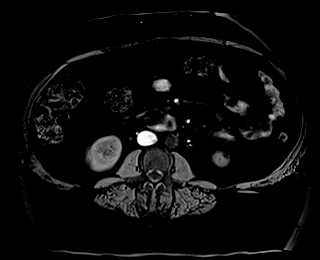
[im 40/80]
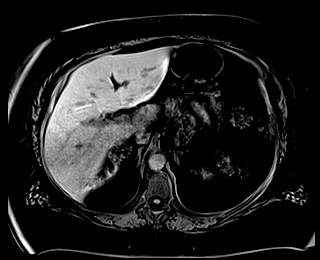
[im 80/80]
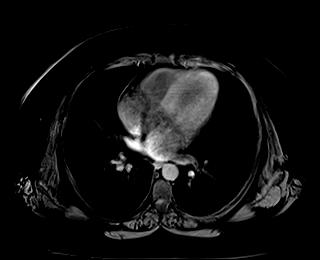

[Series 15: T1 dynamic · axial · 3.0mm · 1.41mm/px · z∈[-147,+88]mm · 3 of 80 slices shown (2 of 9)]
[im 1/80]
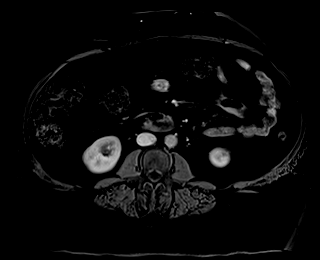
[im 40/80]
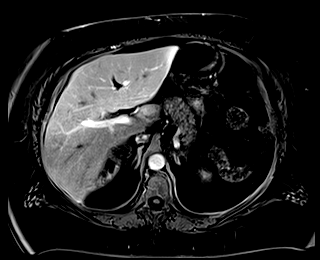
[im 80/80]
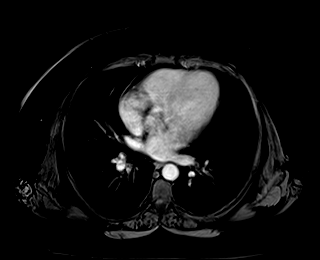

[Series 16: T1 dynamic · axial · 3.0mm · 1.41mm/px · z∈[-147,+88]mm · 3 of 80 slices shown (3 of 9)]
[im 1/80]
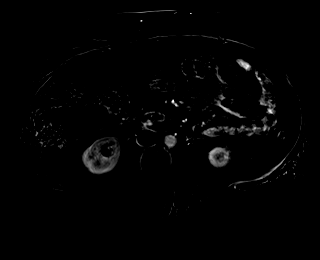
[im 40/80]
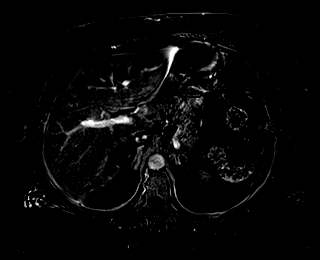
[im 80/80]
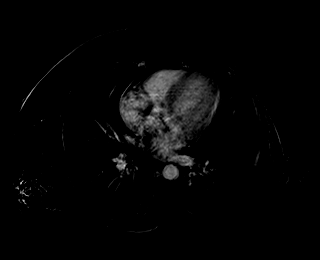

[Series 17: T1 dynamic · axial · 3.0mm · 1.41mm/px · z∈[-147,+88]mm · 3 of 80 slices shown (4 of 9)]
[im 1/80]
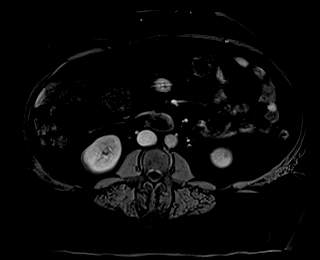
[im 40/80]
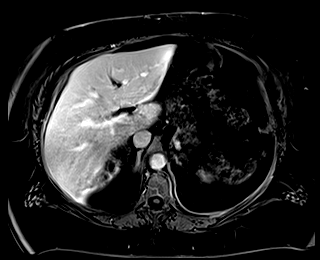
[im 80/80]
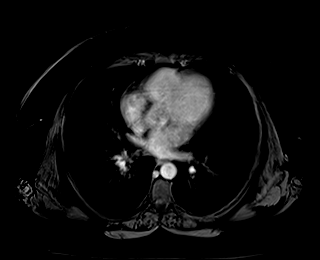

[Series 18: T1 dynamic · axial · 3.0mm · 1.41mm/px · z∈[-147,+88]mm · 3 of 80 slices shown (5 of 9)]
[im 1/80]
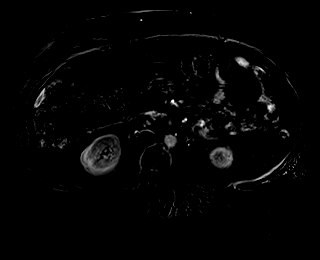
[im 40/80]
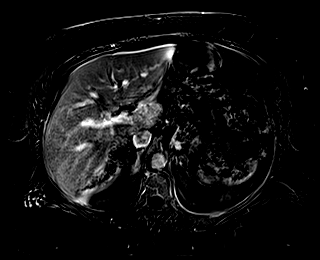
[im 80/80]
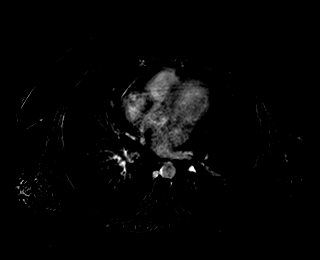

[Series 19: T1 dynamic · axial · 3.0mm · 1.41mm/px · z∈[-147,+88]mm · 3 of 80 slices shown (6 of 9)]
[im 1/80]
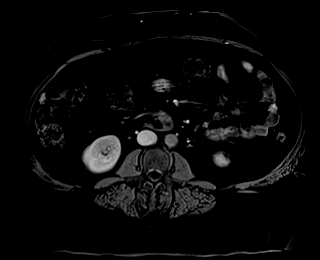
[im 40/80]
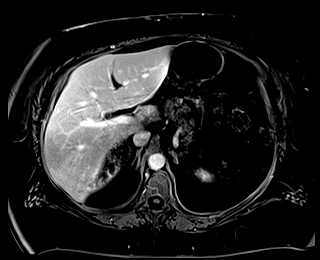
[im 80/80]
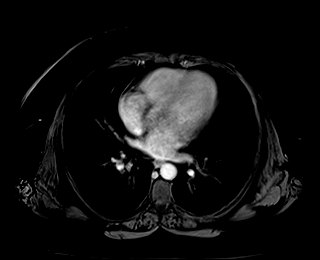

[Series 20: T1 dynamic · axial · 3.0mm · 1.41mm/px · z∈[-147,+88]mm · 3 of 80 slices shown (7 of 9)]
[im 1/80]
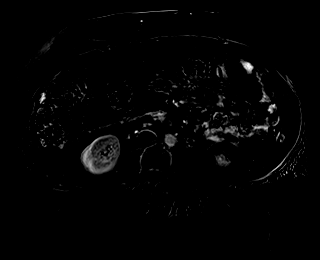
[im 40/80]
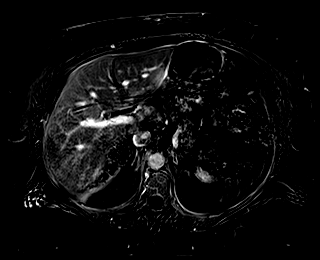
[im 80/80]
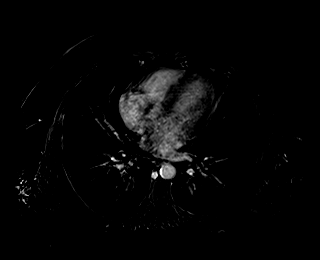

[Series 21: T1 dynamic · axial · 3.0mm · 1.41mm/px · z∈[-147,+88]mm · 3 of 80 slices shown (8 of 9)]
[im 1/80]
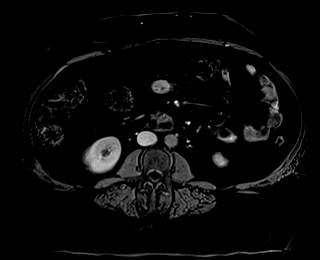
[im 40/80]
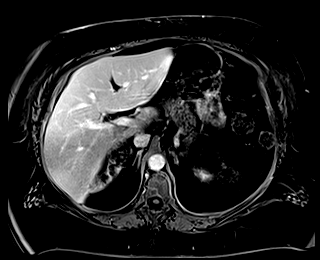
[im 80/80]
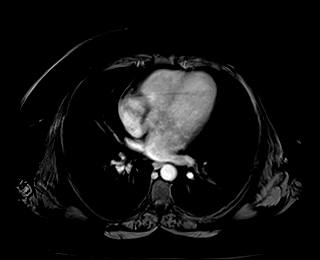

[Series 22: T1 dynamic · axial · 3.0mm · 1.41mm/px · z∈[-147,+88]mm · 3 of 80 slices shown (9 of 9)]
[im 1/80]
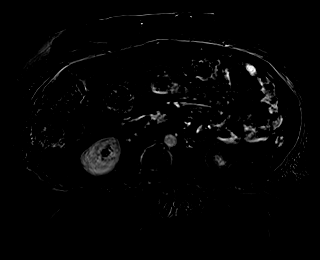
[im 40/80]
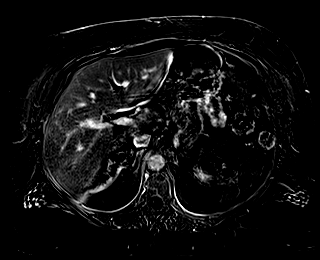
[im 80/80]
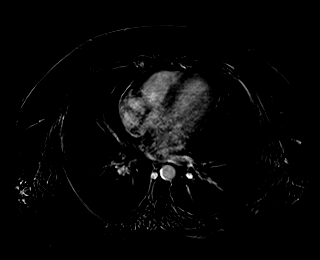

[Series 23: T1 dynamic post-contrast · coronal · 3.0mm · 1.41mm/px · 3 of 88 slices shown]
[im 1/88]
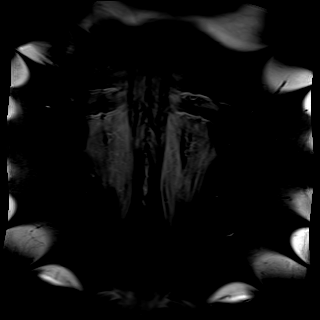
[im 44/88]
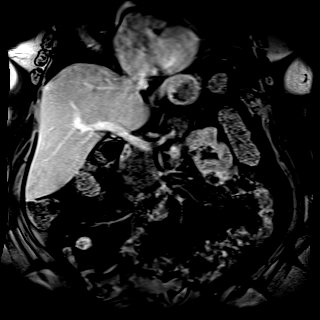
[im 88/88]
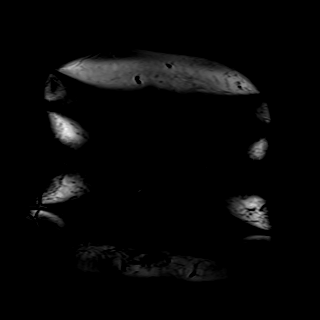

[48 of 48 positions shown; findings below may reference images not displayed]

FINDINGS: Lower chest: No acute findings.

Hepatobiliary: Mild hepatic steatosis. Within the lateral segment of
left lobe of liver there is a subtle, mildly T2 hyperintense
structure measuring 1.6 cm which corresponds to the ultrasound
abnormality. This is isointense to liver on the precontrast T1
weighted sequences. On the arterial phase sequences this appears
isointense to slightly hypointense to the adjacent liver. This
lesion becomes progressively more hyperintense on the delayed
images, image 24/21.

No additional focal liver abnormalities identified. The gallbladder
appears normal. No bile duct dilatation.

Pancreas: No mass, inflammatory changes, or other parenchymal
abnormality identified.

Spleen:  Within normal limits in size and appearance.

Adrenals/Urinary Tract: Normal adrenal glands. No hydronephrosis
identified bilaterally. There are 3 cystic lesions identified within
the left kidney which are technically too small to reliably
characterize measuring up to 8 mm, images 50, image 55, and image 65
of series 19.

Stomach/Bowel: Visualized portions within the abdomen are
unremarkable.

Vascular/Lymphatic: No pathologically enlarged lymph nodes
identified. No abdominal aortic aneurysm demonstrated.

Other:  No ascites or focal fluid collections.

Musculoskeletal: No suspicious bone lesions identified.
IMPRESSION: 1. Within the lateral segment of left lobe of liver there is a
subtle, mildly T2 hyperintense structure measuring 1.6 cm. This is
isointense to liver on the precontrast and arterial phase T1
weighted sequences. On the portal venous and delayed phase images
this lesion becomes progressively more hyperintense to the adjacent
liver. The imaging characteristics are nonspecific, however this is
stable in size when compared with [DATE] favoring a benign
lesion. This could potentially represent an atypical presentation of
a benign liver hemangioma. Follow-up MRI without and with contrast
material in 3 months is recommended to confirm MRI stability of this
lesion
2. Small cystic lesions within the left kidney are technically too
small to reliably characterize measuring up to 8 mm.

## 2021-04-02 MED ORDER — GADOBUTROL 1 MMOL/ML IV SOLN
10.0000 mL | Freq: Once | INTRAVENOUS | Status: AC | PRN
  Administered 2021-04-02: 15:00:00 10 mL via INTRAVENOUS

## 2021-05-06 ENCOUNTER — Telehealth: Payer: Self-pay | Admitting: Cardiology

## 2021-05-06 NOTE — Telephone Encounter (Signed)
Pt c/o BP issue: STAT if pt c/o blurred vision, one-sided weakness or slurred speech  1. What are your last 5 BP readings?   1/28 : 110 1/29: 117/79 1/30: 114/79 91 1/31: 111/75 92 2/01: 109/81 93 2/02: 114/75 96  2. Are you having any other symptoms (ex. Dizziness, headache, blurred vision, passed out)?  No symptoms aside from minor cold symptoms   3. What is your BP issue?   Patient is calling to report BP reading. She states she was told to do so after starting on BP medication. She would also like to discuss having lab work done. I do not see any orders for lab work. Please advise.

## 2021-05-11 ENCOUNTER — Other Ambulatory Visit: Payer: BC Managed Care – PPO

## 2021-05-11 ENCOUNTER — Other Ambulatory Visit (HOSPITAL_COMMUNITY)
Admission: RE | Admit: 2021-05-11 | Discharge: 2021-05-11 | Disposition: A | Discharge status: Home / Self Care | Payer: BC Managed Care – PPO | Source: Ambulatory Visit | Attending: Cardiology | Admitting: Cardiology

## 2021-05-11 ENCOUNTER — Other Ambulatory Visit: Payer: Self-pay

## 2021-05-11 DIAGNOSIS — R0602 Shortness of breath: Secondary | ICD-10-CM | POA: Diagnosis not present

## 2021-05-11 DIAGNOSIS — I10 Essential (primary) hypertension: Secondary | ICD-10-CM | POA: Insufficient documentation

## 2021-05-11 LAB — BASIC METABOLIC PANEL
Anion gap: 8 (ref 5–15)
BUN: 16 mg/dL (ref 6–20)
CO2: 29 mmol/L (ref 22–32)
Calcium: 9.2 mg/dL (ref 8.9–10.3)
Chloride: 98 mmol/L (ref 98–111)
Creatinine, Ser: 0.5 mg/dL (ref 0.44–1.00)
GFR, Estimated: >60 mL/min (ref >60)
Glucose, Bld: 131 mg/dL — ABNORMAL HIGH (ref 70–99)
Potassium: 3.8 mmol/L (ref 3.5–5.1)
Sodium: 135 mmol/L (ref 135–145)

## 2021-05-11 LAB — MAGNESIUM: Magnesium: 1.8 mg/dL (ref 1.7–2.4)

## 2021-05-11 NOTE — Telephone Encounter (Signed)
BP looks good, labs from today also look good. No changes  Zandra Abts MD

## 2021-05-11 NOTE — Telephone Encounter (Signed)
Left message to return call 

## 2021-05-13 ENCOUNTER — Inpatient Hospital Stay: Admission: RE | Admit: 2021-05-13 | Payer: BC Managed Care – PPO | Source: Ambulatory Visit

## 2021-05-13 NOTE — Telephone Encounter (Signed)
Patient returned call, please call her at [phone] that is her work number.

## 2021-05-13 NOTE — Telephone Encounter (Signed)
Patient notified and verbalized understanding.   She c/o feeling tingling / weakness in her legs off/on x 1 week.  States that she did not start medication till 04/26/2021.

## 2021-05-14 NOTE — Telephone Encounter (Signed)
Not a common side effect of the medication, would monitor another 1-2 weeks to see if any changes and update [place]  Dominga Ferry MD

## 2021-05-14 NOTE — Telephone Encounter (Signed)
No answer at home or mobile number.

## 2021-05-17 NOTE — Telephone Encounter (Signed)
Patient notified and verbalized understanding.  Stated that she did just see her pcp & is checking some vitamins.  Could not remember which ones.

## 2021-05-18 ENCOUNTER — Ambulatory Visit (INDEPENDENT_AMBULATORY_CARE_PROVIDER_SITE_OTHER): Payer: BC Managed Care – PPO

## 2021-05-18 DIAGNOSIS — R0602 Shortness of breath: Secondary | ICD-10-CM

## 2021-05-18 LAB — ECHOCARDIOGRAM COMPLETE
AR max vel: 2.6 cm2
AV Area VTI: 2.47 cm2
AV Area mean vel: 2.34 cm2
AV Mean grad: 11 mmHg
AV Peak grad: 17.5 mmHg
Ao pk vel: 2.09 m/s
Area-P 1/2: 2.24 cm2
Calc EF: 69.9 %
S' Lateral: 2.93 cm
Single Plane A2C EF: 71.4 %
Single Plane A4C EF: 68.6 %

## 2021-05-19 ENCOUNTER — Encounter: Payer: Self-pay | Admitting: *Deleted

## 2021-05-19 ENCOUNTER — Telehealth: Payer: Self-pay | Admitting: *Deleted

## 2021-05-19 NOTE — Telephone Encounter (Signed)
-----   Message from Antoine Poche, MD sent at 05/16/2021  9:24 AM EST ----- Normal labs   Dominga Ferry MD

## 2021-05-19 NOTE — Telephone Encounter (Signed)
Laurine Blazer, LPN  [id number]  624THL PM EST Back to Top    Notified via my chart.  Copy to pcp.

## 2021-05-21 ENCOUNTER — Inpatient Hospital Stay: Admission: RE | Admit: 2021-05-21 | Payer: BC Managed Care – PPO | Source: Ambulatory Visit

## 2021-06-01 ENCOUNTER — Encounter: Payer: Self-pay | Admitting: *Deleted

## 2021-06-01 ENCOUNTER — Telehealth: Payer: Self-pay | Admitting: *Deleted

## 2021-06-01 NOTE — Telephone Encounter (Signed)
Lesle Chris, LPN  07/12/7351 29:92 AM EST Back to Top    Notified via my chart.  Copy to pcp.

## 2021-06-01 NOTE — Telephone Encounter (Signed)
-----   Message from Antoine Poche, MD sent at 05/30/2021  9:48 AM EST ----- Normal echo, heart functio is normal. No findings would indicate the heart as the cause of her SOB or leg edema.   Dominga Ferry MD

## 2021-06-07 ENCOUNTER — Encounter: Payer: Self-pay | Admitting: *Deleted

## 2021-06-07 ENCOUNTER — Other Ambulatory Visit: Payer: Self-pay

## 2021-06-07 ENCOUNTER — Ambulatory Visit (INDEPENDENT_AMBULATORY_CARE_PROVIDER_SITE_OTHER): Payer: BC Managed Care – PPO | Admitting: Pulmonary Disease

## 2021-06-07 ENCOUNTER — Encounter: Payer: Self-pay | Admitting: Pulmonary Disease

## 2021-06-07 VITALS — BP 134/88 | HR 89 | Temp 96.6°F | Ht 67.0 in | Wt 294.0 lb

## 2021-06-07 DIAGNOSIS — G4710 Hypersomnia, unspecified: Secondary | ICD-10-CM | POA: Diagnosis not present

## 2021-06-07 NOTE — Patient Instructions (Signed)
X home sleep test °

## 2021-06-07 NOTE — Progress Notes (Signed)
? ?Subjective:  ? ? Patient ID: Yvette Patterson, female    DOB: 05-Mar-1976, 46 y.o.   MRN: [id number] ? ?HPI ? ?Chief Complaint  ?Patient presents with  ? Sleep Consult  ?  Referred by Dr Dina Rich.  Pt c/o daytime sleepiness and memory issues for at least the past year. She has never had a sleep study. She says she has been told that she snores.   ? 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 ?I have reviewed cardiology evaluation for chest pain and palpitations, felt to be a bit typical, echo was normal, blood pressure was noted to be high and she was started on chlorthalidone and losartan increased to 100 mg. ?About a year ago she underwent neurology evaluation for "brain fog" and morning headaches, MRI was normal and a sleep study was recommended but she never followed through. ? ?Epworth Sleepiness Scale is 9 and she reports sleepiness while sitting and reading, watching TV or lying down to rest in the afternoons. ?Bedtime is between 9 and 10 PM, sleep latency is minimal, she sleeps on her left side with 1 pillow, reports 2-3 nocturnal awakenings bloating and occasional wake up around 3 AM for about an hour, her usual wake up time is around 5 AM, she feels groggy and occasionally has morning headaches which resolved spontaneously.  There may be a nap on weekends for 1 to 2 hours.  She reports drinking 12 ounces of sweet tea daily. ?Weight has mostly been unchanged ? ?There is no history suggestive of cataplexy, sleep paralysis or parasomnias ? ? ? ? ? ? ?Past Medical History:  ?Diagnosis Date  ? Anxiety   ? Bilateral carpal tunnel syndrome 08/11/2020  ? Chest pain   ? Diabetes mellitus without complication (HCC)   ? Edema, unspecified   ? History of kidney stones   ? Hypertension   ? 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 ? ?Past Surgical History:  ?Procedure Laterality Date  ? BIOPSY  10/30/2020  ? Procedure: BIOPSY;  Surgeon: Lanelle Bal, DO;  [facility];  Service: Endoscopy;;  ? COLONOSCOPY WITH PROPOFOL N/A 10/30/2020  ? Procedure: COLONOSCOPY WITH PROPOFOL;  Surgeon: Lanelle Bal, DO;  [facility]: [facility];  Service: Endoscopy;  Laterality: N/A;  8:30am  ? HYSTEROSCOPY    ? KNEE SURGERY    ? OVARIAN CYST REMOVAL    ? ? ?Allergies  ?Allergen Reactions  ? Lisinopril Swelling  ?  Facial swelling ?  ? ? ?Social History  ? ?Socioeconomic History  ? Marital status: Married  ?  Spouse name: Not on file  ? Number of children: 1  ? Years of education: Not on file  ? Highest education level: Not on file  ?Occupational History  ? Occupation: Clinical biochemist  ?Tobacco Use  ? Smoking status: Never  ? Smokeless tobacco: Never  ?Vaping Use  ? Vaping Use: Never used  ?Substance and Sexual Activity  ? Alcohol use: Yes  ?  Comment: occ  ? Drug use: No  ? Sexual activity: Not on file  ?Other Topics Concern  ? Not on file  ?Social History Narrative  ? Lives with husband and son  ? Right Handed  ? Drinks 2-4 cup caffeine daily  ? ?Social Determinants of Health  ? ?Financial Resource Strain: Not on file  ?Food Insecurity: Not on file  ?Transportation Needs: Not on file  ?Physical Activity: Not on  file  ?Stress: Not on file  ?Social Connections: Not on file  ?Intimate Partner Violence: Not on file  ? ? ? ?Family History  ?Problem Relation Age of Onset  ? Colon cancer Father   ? Diabetes Father   ? Stroke Father   ? Kidney Stones Mother   ? Colon cancer Paternal Grandfather   ? ? ? ?Review of Systems ?Constitutional: negative for anorexia, fevers and sweats  ?Eyes: negative for irritation, redness and visual disturbance  ?Ears, nose, mouth, throat, and face: negative for earaches, epistaxis, nasal congestion and sore throat  ?Respiratory: negative for cough, dyspnea on exertion, sputum and wheezing  ?Cardiovascular: negative for chest pain, dyspnea, lower extremity edema, orthopnea, palpitations and syncope  ?Gastrointestinal: negative for  abdominal pain, constipation, diarrhea, melena, nausea and vomiting  ?Genitourinary:negative for dysuria, frequency and hematuria  ?Hematologic/lymphatic: negative for bleeding, easy bruising and lymphadenopathy  ?Musculoskeletal:negative for arthralgias, muscle weakness and stiff joints  ?Neurological: negative for coordination problems, gait problems, headaches and weakness  ?Endocrine: negative for diabetic symptoms including polydipsia, polyuria and weight loss ? ?   ?Objective:  ? Physical Exam ? ?Gen. Pleasant, obese, in no distress, normal affect ?ENT - no pallor,icterus, no post nasal drip, class 2 airway ?Neck: No JVD, no thyromegaly, no carotid bruits ?Lungs: no use of accessory muscles, no dullness to percussion, decreased without rales or rhonchi  ?Cardiovascular: Rhythm regular, heart sounds  normal, no murmurs or gallops, no peripheral edema ?Abdomen: soft and non-tender, no hepatosplenomegaly, BS normal. ?Musculoskeletal: No deformities, no cyanosis or clubbing ?Neuro:  alert, non focal, no tremors ? ? ? ?   ?Assessment & Plan:  ? ? ?Hypersomnolence - Given excessive daytime somnolence, narrow pharyngeal exam, witnessed apneas & loud snoring, obstructive sleep apnea is possible & an overnight polysomnogram will be scheduled as a home study. The pathophysiology of obstructive sleep apnea , it's cardiovascular consequences & modes of treatment including CPAP were discused with the patient in detail & they evidenced understanding. ? ?Pretest probability is high.  She feels she is a mouth breather.  Her father had OSA and uses CPAP machine so she is familiar with the device but has obvious hesitancy in using this.  I explained that we would require CPAP if AHI more than 15 ? ? ?Obesity -weight loss measures encouraged, she has diabetes and hypertension as comorbidities ? ? ?

## 2021-06-21 ENCOUNTER — Telehealth: Payer: Self-pay

## 2021-06-21 NOTE — Telephone Encounter (Signed)
Yvette Patterson at [facility]. Pt is scheduled for CT virtual colonoscopy 06/23/21. Needs new auth d/t previus auth expired. [phone]) [phone], [phone]. ? ?New PA for CT virtual colonoscopy submitted via [facility] webiste. Order ID: [id number], valid 06/21/21-07/20/21.  ? ?Called Yvette Patterson at GI, informed her of new auth. ?

## 2021-06-22 NOTE — Telephone Encounter (Signed)
I would wait on cancelling referral for TCS at [facility] because if the virtual colonoscopy is positive she will need TCS at [facility].  ?

## 2021-06-22 NOTE — Telephone Encounter (Signed)
Yvette Patterson, she is scheduled for virtual colonoscopy tomorrow at GI. Should we cancel referral for TCS at [facility]? ?

## 2021-06-23 ENCOUNTER — Ambulatory Visit
Admission: RE | Admit: 2021-06-23 | Discharge: 2021-06-23 | Disposition: A | Discharge status: Home / Self Care | Payer: BC Managed Care – PPO | Source: Ambulatory Visit | Attending: Gastroenterology | Admitting: Gastroenterology

## 2021-06-23 DIAGNOSIS — Z800 Family history of malignant neoplasm of digestive organs: Secondary | ICD-10-CM

## 2021-06-23 DIAGNOSIS — R197 Diarrhea, unspecified: Secondary | ICD-10-CM

## 2021-06-23 IMAGING — CT CT VIRTUAL COLONOSCOPY DIAGNOSTIC
2 of 9 series · 12 of 46 positions shown, 18 images · non-contrast
Comparison: None.

CLINICAL DATA: Incomplete colonoscopy

EXAM:
CT VIRTUAL COLONOSCOPY DIAGNOSTIC
TECHNIQUE: The patient was given a standard bowel preparation with Gastrografin
and barium for fluid and stool tagging respectively. The quality of
the bowel preparation is fair. Automated CO2 insufflation of the
colon was performed prior to image acquisition and colonic
distention is fair. Image post processing was used to generate a 3D
endoluminal fly-through projection of the colon and to
electronically subtract stool/fluid as appropriate.

[Series 3: supine colon 1.50 br40 s3 supine thins · axial · 0.88mm/px · z∈[+1096,+1580]mm · 9 of 405 slices shown, 15 images]
[im 41/405  soft-tissue]
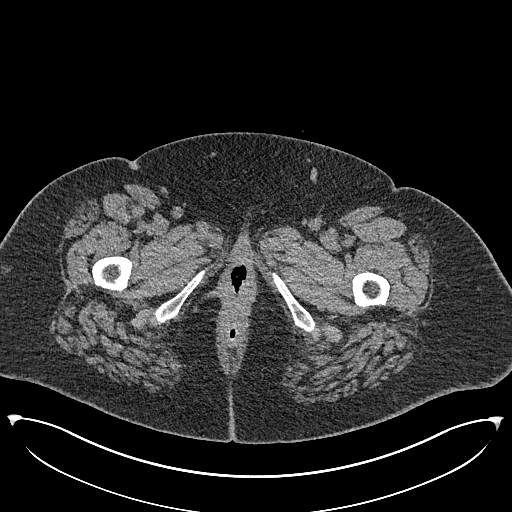
[im 41/405  bone]
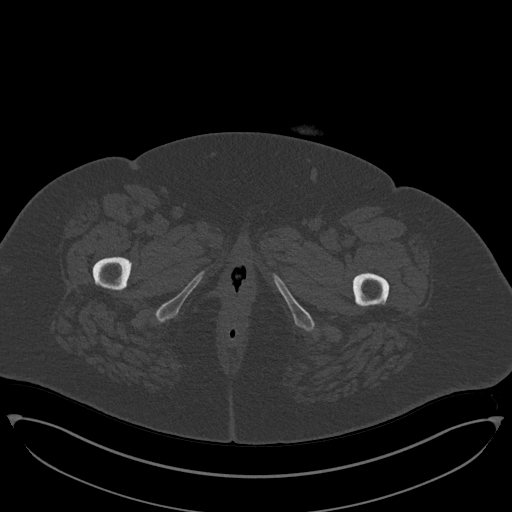
[im 81/405  soft-tissue]
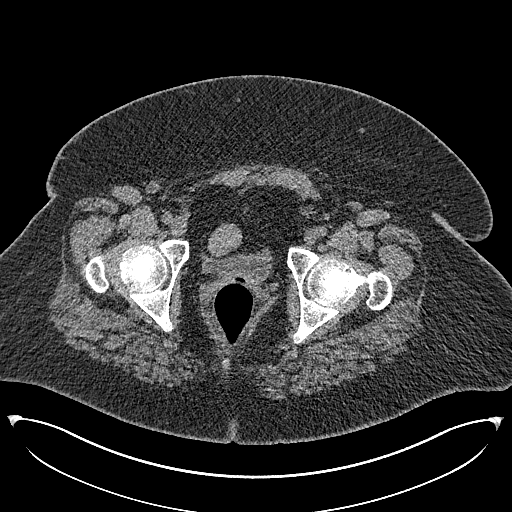
[im 122/405  soft-tissue]
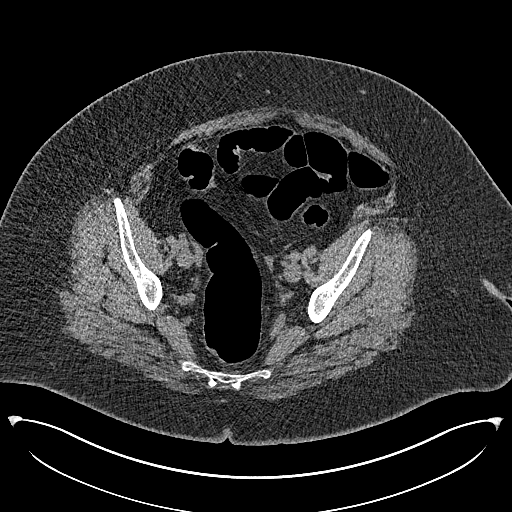
[im 162/405  soft-tissue]
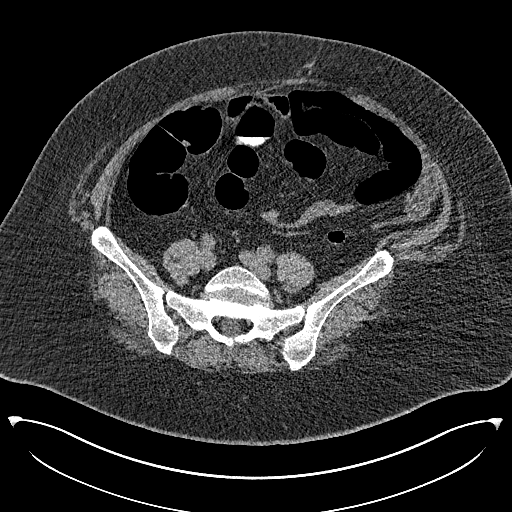
[im 203/405  soft-tissue]
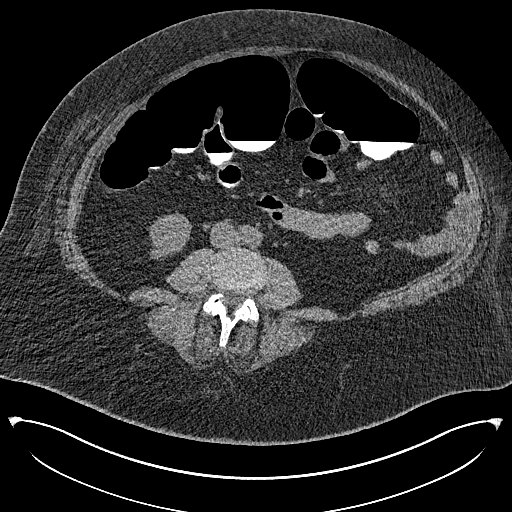
[im 243/405  soft-tissue]
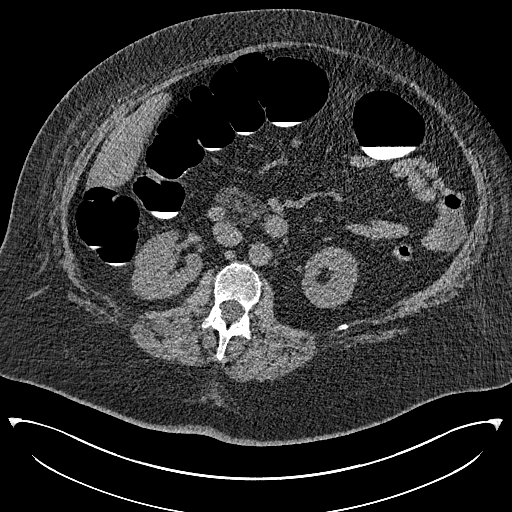
[im 243/405  lung]
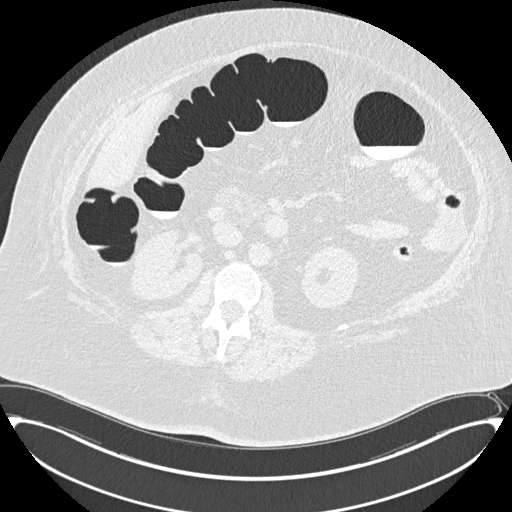
[im 283/405  soft-tissue]
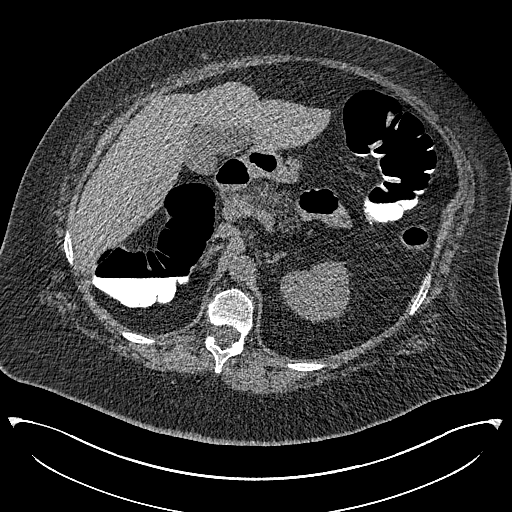
[im 283/405  lung]
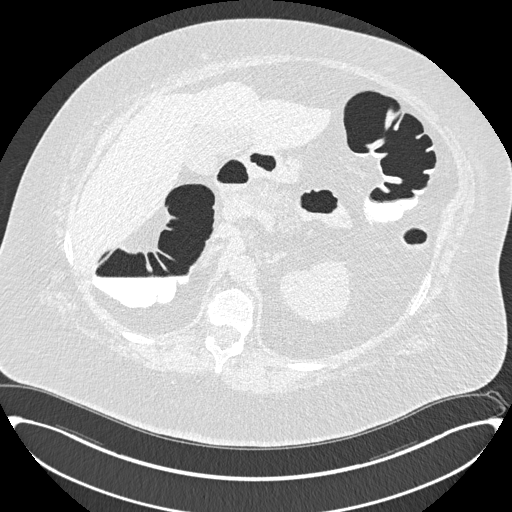
[im 324/405  soft-tissue]
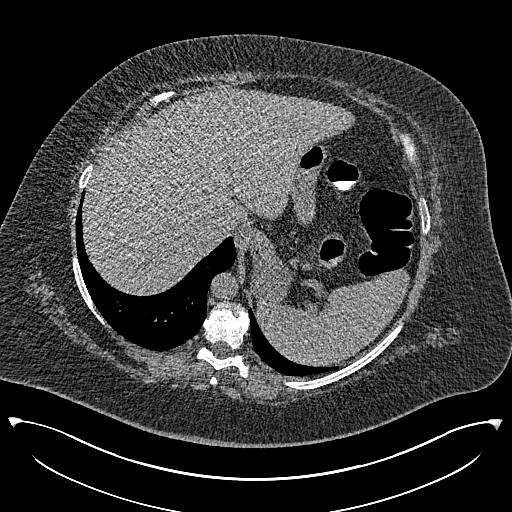
[im 324/405  lung]
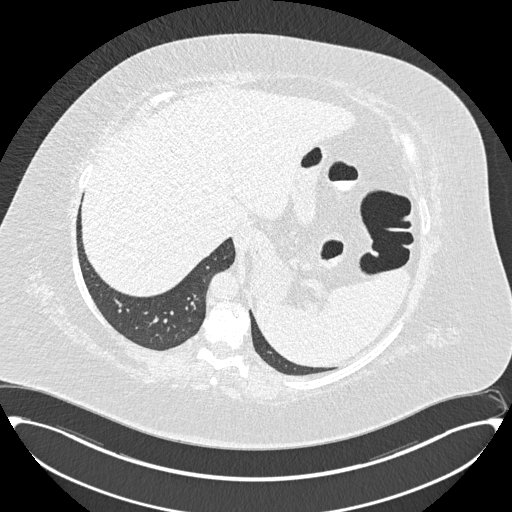
[im 364/405  soft-tissue]
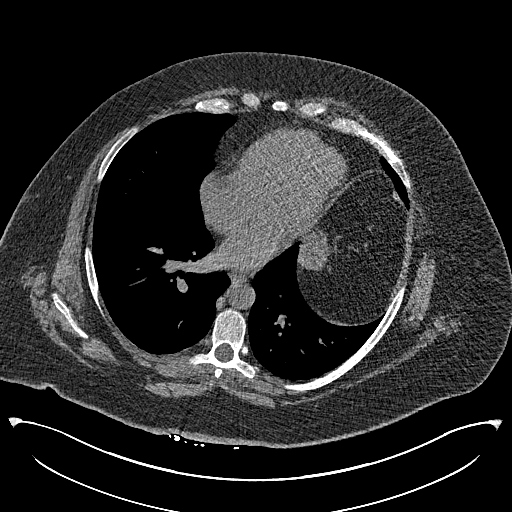
[im 364/405  lung]
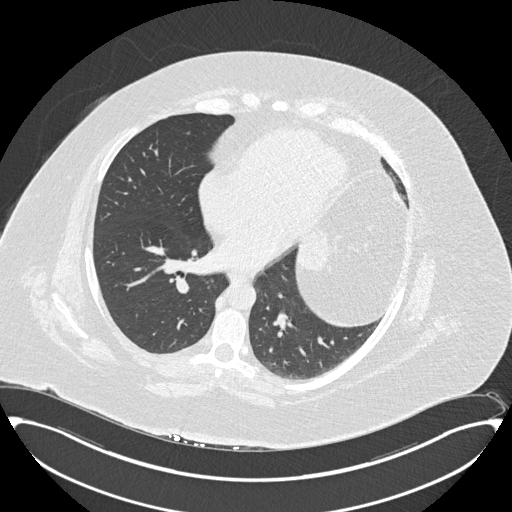
[im 364/405  bone]
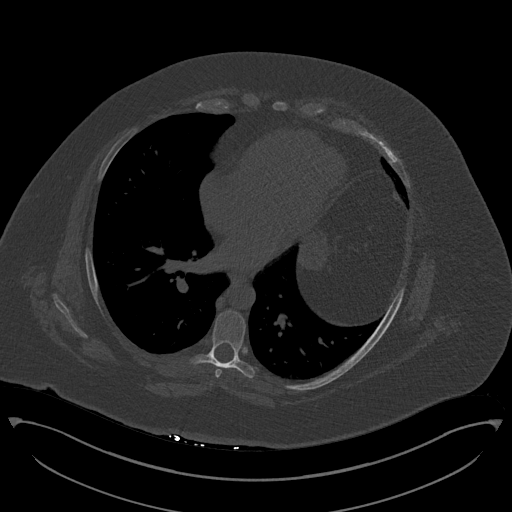

[Series 5: supine colon 3.00 br40 s3 cor supine · coronal · 1.04mm/px · 3 of 151 slices shown]
[im 38/151  soft-tissue]
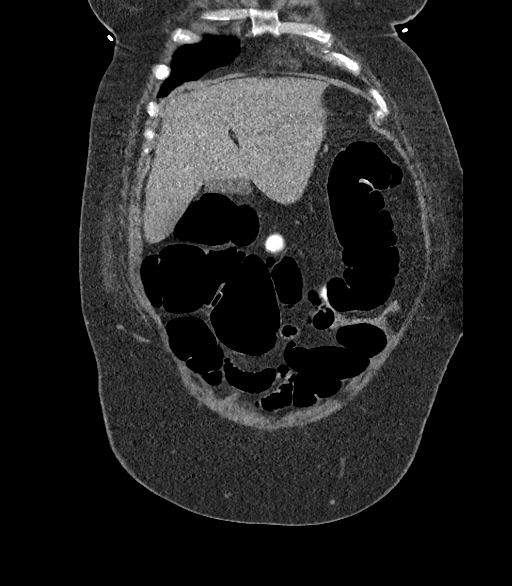
[im 76/151  soft-tissue]
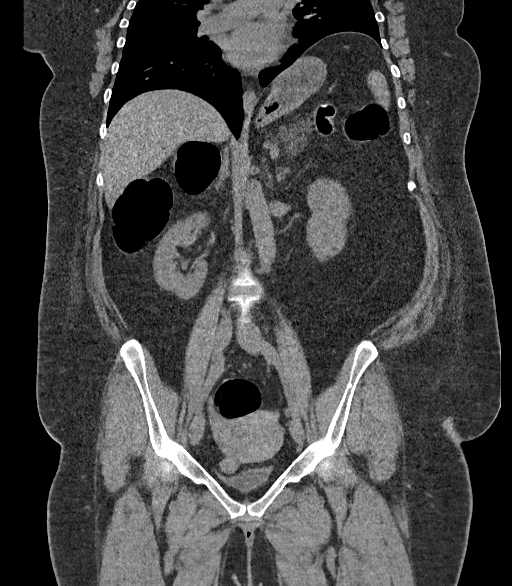
[im 113/151  soft-tissue]
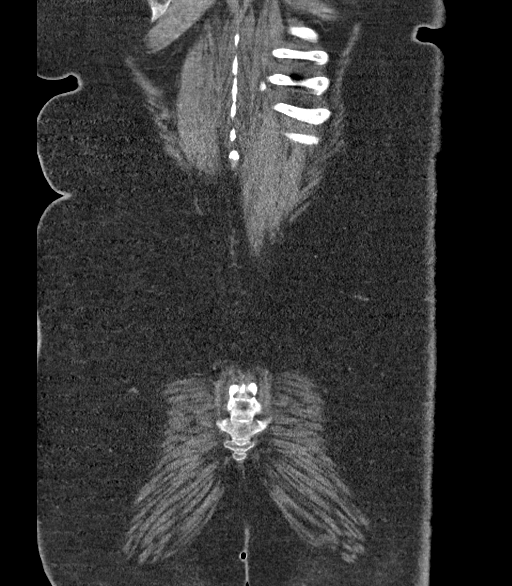

[12 of 46 positions shown; findings below may reference images not displayed]

FINDINGS: VIRTUAL COLONOSCOPY

Portions of the sigmoid colon and descending colon are under
distended and not well evaluated. Redundant, tortuous sigmoid colon
and transverse colon. Portions of the transverse colon and ascending
colon not well distended on prone imaging, but better distended and
evaluated on supine imaging. No fixed polypoid filling defects or
annular constricting lesions.

Virtual colonoscopy is not designed to detect diminutive polyps
(i.e., less than or equal to 5 mm), the presence or absence of which
may not affect clinical management.

CT ABDOMEN AND PELVIS WITHOUT CONTRAST

Lower chest: Lung bases are clear. No effusions. Heart is normal
size.

Hepatobiliary: No focal hepatic abnormality. Gallbladder
unremarkable.

Pancreas: Fatty replacement. No focal abnormality or ductal
dilatation.

Spleen: No focal abnormality.  Normal size.

Adrenals/Urinary Tract: 3 mm nonobstructing stone in the midpole of
the right kidney. No ureteral stones or hydronephrosis. Urinary
bladder and adrenal glands unremarkable.

Stomach/Bowel: Stomach and small bowel decompressed, grossly
unremarkable.

Vascular/Lymphatic: No evidence of aneurysm or adenopathy.

Reproductive: 2.7 cm exophytic fibroid off the anterior fundus. No
adnexal mass.

Other: No free fluid or free air.

Musculoskeletal: No acute bony abnormality.
IMPRESSION: Redundant, tortuous sigmoid colon and transverse colon. No visible
fixed polypoid filling defects or annular constricting lesions.

Portions of the sigmoid colon and descending colon not well
distended on both supine and prone imaging and therefore not well
evaluated. Recommend correlation with prior colonoscopy.

Right nephrolithiasis.

No acute extra colonic abnormality.

## 2021-06-24 ENCOUNTER — Ambulatory Visit: Payer: BC Managed Care – PPO | Admitting: Cardiology

## 2021-06-30 ENCOUNTER — Other Ambulatory Visit: Payer: Self-pay

## 2021-07-02 ENCOUNTER — Encounter (HOSPITAL_COMMUNITY): Payer: Self-pay | Admitting: Radiology

## 2021-07-12 ENCOUNTER — Telehealth: Payer: Self-pay | Admitting: Internal Medicine

## 2021-07-12 NOTE — Telephone Encounter (Signed)
RECALL FOR MRI 

## 2021-07-12 NOTE — Telephone Encounter (Signed)
Letter mailed

## 2021-07-14 ENCOUNTER — Ambulatory Visit (INDEPENDENT_AMBULATORY_CARE_PROVIDER_SITE_OTHER): Payer: BC Managed Care – PPO | Admitting: Cardiology

## 2021-07-14 ENCOUNTER — Encounter: Payer: Self-pay | Admitting: Cardiology

## 2021-07-14 VITALS — BP 128/90 | HR 70 | Ht 67.0 in | Wt 293.8 lb

## 2021-07-14 DIAGNOSIS — I10 Essential (primary) hypertension: Secondary | ICD-10-CM

## 2021-07-14 DIAGNOSIS — R002 Palpitations: Secondary | ICD-10-CM

## 2021-07-14 DIAGNOSIS — R0602 Shortness of breath: Secondary | ICD-10-CM | POA: Diagnosis not present

## 2021-07-14 DIAGNOSIS — R0789 Other chest pain: Secondary | ICD-10-CM

## 2021-07-14 NOTE — Patient Instructions (Signed)
Medication Instructions:   Your physician recommends that you continue on your current medications as directed. Please refer to the Current Medication list given to you today.  Labwork:  none  Testing/Procedures:  none  Follow-Up:  Your physician recommends that you schedule a follow-up appointment in: as needed.  Any Other Special Instructions Will Be Listed Below (If Applicable).  If you need a refill on your cardiac medications before your next appointment, please call your pharmacy. 

## 2021-07-14 NOTE — Progress Notes (Signed)
? ? ? ?Clinical Summary ?[name] is a 46 y.o.female seen today for follow up of the following medical problems.  ? ?Chest pain ?- sporadic symptoms ?- sharp pain typically, rare pressure. Mid to left chest. Lasts just a few seconds. No other associated symptoms. Could happen at rest or with activity. Occurs about once every 2 months ?- highest level of activity is doing housework, some fatigue with activities but no severe SOB. ?- some DOE with high levels of activity above her normal range.  ?- some recent LE edema. Ongoing for a few years. She is on lasix 20mg  daily, takes as needed based on swelling.  ?  ? 05/2021 echo: LVEF 60-65%, no WMAs, normal diastolic function, no significant valvular disease ? ?- no recent chest pains ?  ?  ?2. Palpitations ?- 3-4 episodes over the last year ?- unclear if specific pattern, one did happen after a nightmare.  ?- tea x 1 cup in AM, no coffee, rare sodas, no energy drinks, occasional EtoH ?  ?- rare palpitations since last visit.  ?  ?  ?2. LE edema ?- has been on lasix by pcp ?- denies recent edema. Has not needed prn lasix since starting daily chlorthalidone.  ?  ?  ?3. HTN ?- compliant with meds ?  ? - last visit started chlorthalidone, f/u labs were fine ?- home bp's are at goal ? ?4. OSA screen ?- +snoring, daytime somnolence.  ?- seen by pulmonary with plans for sleep testing ?  ?  ? ?  ?  ? Yvette Patterson and Yvette Patterson Patterson are her parents. Father passed away few years ago.  ?47 yo son.  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  ? ? ?Past Medical History:  ?Diagnosis Date  ? Anxiety   ? Bilateral carpal tunnel syndrome 08/11/2020  ? Chest pain   ? Diabetes mellitus without complication (HCC)   ? Edema, unspecified   ? History of kidney stones   ? Hypertension   ? 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 ? ? ?Allergies  ?Allergen Reactions  ? Lisinopril Swelling  ?  Facial swelling ?   ? ? ? ?Current Outpatient Medications  ?Medication Sig Dispense Refill  ? acetaminophen (TYLENOL) 500 MG tablet Take 1,000 mg by mouth every 6 (six) hours as needed for moderate pain, mild pain, headache or fever.    ? chlorthalidone (HYGROTON) 25 MG tablet Take 0.5 tablets (12.5 mg total) by mouth daily. 15 tablet 6  ? Cyanocobalamin (VITAMIN B 12 PO) Take by mouth every other day.    ? fluticasone (FLONASE) 50 MCG/ACT nasal spray Place 2 sprays into both nostrils daily as needed for allergies. As needed    ? furosemide (LASIX) 20 MG tablet Take 20 mg by mouth daily as needed.    ? levocetirizine (XYZAL) 2.5 MG/5ML solution Take 2.5 mg by mouth at bedtime as needed for allergies.    ? losartan (COZAAR) 100 MG tablet Take 100 mg by mouth daily.    ? metFORMIN (GLUCOPHAGE-XR) 500 MG 24 hr tablet     ? mometasone (ELOCON) 0.1 % ointment Apply 1 application topically at bedtime as needed (Eczema).    ? naproxen sodium (ALEVE) 220 MG tablet Take 220 mg by mouth daily as needed (pain).    ? Vitamin D, Ergocalciferol, (DRISDOL) 1.25 MG (50000 UNIT) CAPS capsule Take 50,000 Units by mouth once a week.    ? ?  No current facility-administered medications for this visit.  ? ? ? ?Past Surgical History:  ?Procedure Laterality Date  ? BIOPSY  10/30/2020  ? Procedure: BIOPSY;  Surgeon: Lanelle Bal, DO;  [facility];  Service: Endoscopy;;  ? COLONOSCOPY WITH PROPOFOL N/A 10/30/2020  ? Procedure: COLONOSCOPY WITH PROPOFOL;  Surgeon: Lanelle Bal, DO;  [facility];  Service: Endoscopy;  Laterality: N/A;  8:30am  ? HYSTEROSCOPY    ? KNEE SURGERY    ? OVARIAN CYST REMOVAL    ? ? ? ?Allergies  ?Allergen Reactions  ? Lisinopril Swelling  ?  Facial swelling ?  ? ? ? ? ?Family History  ?Problem Relation Age of Onset  ? Colon cancer Father   ? Diabetes Father   ? Stroke Father   ? Kidney Stones Mother   ? Colon cancer Paternal Grandfather   ? ? ? ?Social History ?Yvette Patterson reports that she has never  smoked. She has never used smokeless tobacco. ?Yvette Patterson reports current alcohol use. ? ? ?Review of Systems ?CONSTITUTIONAL: No weight loss, fever, chills, weakness or fatigue.  ?HEENT: Eyes: No visual loss, blurred vision, double vision or yellow sclerae.No hearing loss, sneezing, congestion, runny nose or sore throat.  ?SKIN: No rash or itching.  ?CARDIOVASCULAR: per hpi ?RESPIRATORY: No shortness of breath, cough or sputum.  ?GASTROINTESTINAL: No anorexia, nausea, vomiting or diarrhea. No abdominal pain or blood.  ?GENITOURINARY: No burning on urination, no polyuria ?NEUROLOGICAL: No headache, dizziness, syncope, paralysis, ataxia, numbness or tingling in the extremities. No change in bowel or bladder control.  ?MUSCULOSKELETAL: No muscle, back pain, joint pain or stiffness.  ?LYMPHATICS: No enlarged nodes. No history of splenectomy.  ?PSYCHIATRIC: No history of depression or anxiety.  ?ENDOCRINOLOGIC: No reports of sweating, cold or heat intolerance. No polyuria or polydipsia.  ?. ? ? ?Physical Examination ?Today's Vitals  ? 07/14/21 0838 07/14/21 0848  ?BP: (!) 130/96 128/90  ?Pulse: 70   ?SpO2: 97%   ?Weight: 293 lb 12.8 oz (133.3 kg)   ?Height: 5\' 7"  (1.702 m)   ? ?Body mass index is 46.02 kg/m?. ? ?Gen: resting comfortably, no acute distress ?HEENT: no scleral icterus, pupils equal round and reactive, no palptable cervical adenopathy,  ?CV: RRR, no m/r/g, no jvd ?Resp: Clear to auscultation bilaterally ?GI: abdomen is soft, non-tender, non-distended, normal bowel sounds, no hepatosplenomegaly ?MSK: extremities are warm, no edema.  ?Skin: warm, no rash ?Neuro:  no focal deficits ?Psych: appropriate affect ? ? ?Diagnostic Studies ? ?05/2021 echo ? ?1. Left ventricular ejection fraction, by estimation, is 60 to 65%. The  ?left ventricle has normal function. The left ventricle has no regional  ?wall motion abnormalities. Left ventricular diastolic parameters were  ?normal.  ? 2. Right ventricular systolic  function is normal. The right ventricular  ?size is normal. Tricuspid regurgitation signal is inadequate for assessing  ?PA pressure.  ? 3. The mitral valve is grossly normal. Trivial mitral valve  ?regurgitation.  ? 4. The aortic valve was not well visualized. Aortic valve regurgitation  ?is not visualized. Aortic valve mean gradient measures 11.0 mmHg, not  ?clearly stenotic based on limited views..  ? 5. The inferior vena cava is normal in size with greater than 50%  ?respiratory variability, suggesting right atrial pressure of 3 mmHg.  ? ?Assessment and Plan  ? ?1.Chest pain ?- atypical symptoms that have resolved ?- contineu to monitor, no plans for ischemic testing at this time ?  ?2. SOB/LE edema ?-  benign echo, no plans for additional cardiac testing at this time.  ?  ?3. HTN ?- at goal, continue current meds ? ?4. Palpitations ?- resolved, will continue to monitor at this time. If significant progression consider home monitor.  ?  ?F/u as needed ? ? ? ? ?Antoine Poche, M.D. ?

## 2021-07-21 NOTE — Telephone Encounter (Signed)
Per [facility] surgery scheduler, pt declined schedule TCS with them for now ?

## 2021-07-22 NOTE — Telephone Encounter (Signed)
That's appropriate. See virtual colonoscopy result notes. We are planning colonoscopy in 2027, already NIC'd. ?

## 2021-08-09 ENCOUNTER — Telehealth: Payer: Self-pay | Admitting: Internal Medicine

## 2021-08-09 DIAGNOSIS — K769 Liver disease, unspecified: Secondary | ICD-10-CM

## 2021-08-09 DIAGNOSIS — N289 Disorder of kidney and ureter, unspecified: Secondary | ICD-10-CM

## 2021-08-09 NOTE — Telephone Encounter (Signed)
Patient received letter to schedule mri  

## 2021-08-10 NOTE — Addendum Note (Signed)
Addended by: Cheron Every on: 08/10/2021 09:40 AM ? ? Modules accepted: Orders ? ?

## 2021-08-10 NOTE — Telephone Encounter (Signed)
PA approved via [employer]lon. Auth# [id number], DOS: 08/10/2021 - 09/08/2021 ? ?MRI scheduled for 5/25, arrival 8:30am, npo 4 hrs prior. Claled pt, no answer. Left detailed VM with appt details. ?

## 2021-08-26 ENCOUNTER — Ambulatory Visit (HOSPITAL_COMMUNITY): Payer: BC Managed Care – PPO

## 2021-09-03 ENCOUNTER — Ambulatory Visit (HOSPITAL_COMMUNITY)
Admission: RE | Admit: 2021-09-03 | Discharge: 2021-09-03 | Disposition: A | Discharge status: Home / Self Care | Payer: BC Managed Care – PPO | Source: Ambulatory Visit | Attending: Gastroenterology | Admitting: Gastroenterology

## 2021-09-03 DIAGNOSIS — K769 Liver disease, unspecified: Secondary | ICD-10-CM | POA: Insufficient documentation

## 2021-09-03 DIAGNOSIS — N289 Disorder of kidney and ureter, unspecified: Secondary | ICD-10-CM | POA: Insufficient documentation

## 2021-09-03 IMAGING — MR MR ABDOMEN WO/W CM
22 series · 48 of 48 positions shown · IV contrast (gadavist)
Comparison: MRI [DATE] and ultrasound [DATE].

CLINICAL DATA: Follow-up lesion in the left lobe of the liver and
renal cysts.

EXAM:
MRI ABDOMEN WITHOUT AND WITH CONTRAST
TECHNIQUE: Multiplanar multisequence MR imaging of the abdomen was performed
both before and after the administration of intravenous contrast.
CONTRAST:  10mL GADAVIST GADOBUTROL 1 MMOL/ML IV SOLN

[Series 3: cor haste · coronal · 6.0mm · 1.25mm/px · 1 of 35 slices shown]
[im 1/35]
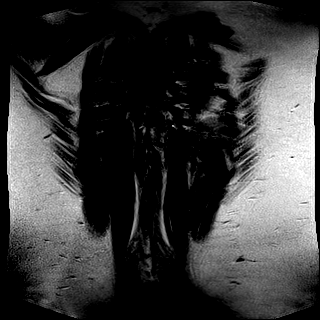

[Series 6: T2 fat-sat · axial · 6.0mm · 1.41mm/px · 1 of 43 slices shown]
[im 1/43]
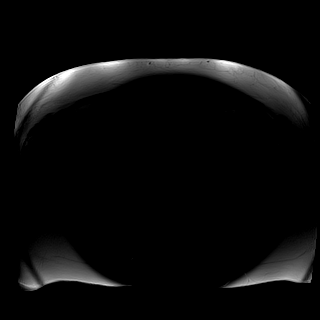

[Series 7: DWI · axial · 6.0mm · 1.68mm/px · 1 of 40 slices shown (1 of 4)]
[im 1/40]
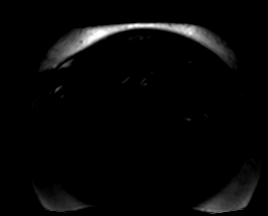

[Series 7: DWI · axial · 6.0mm · 1.68mm/px · 1 of 40 slices shown (2 of 4)]
[im 1/40]
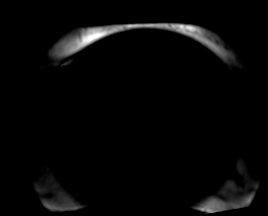

[Series 7: DWI · axial · 6.0mm · 1.68mm/px · 1 of 40 slices shown (3 of 4)]
[im 1/40]
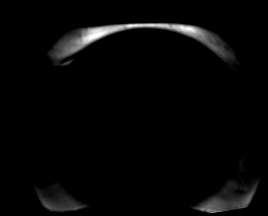

[Series 8: DWI · axial · 6.0mm · 1.68mm/px · 1 of 40 slices shown (4 of 4)]
[im 1/40]
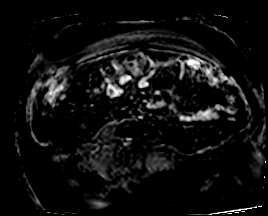

[Series 9: ax haste bh · axial · 6.0mm · 1.41mm/px · 1 of 43 slices shown]
[im 1/43]
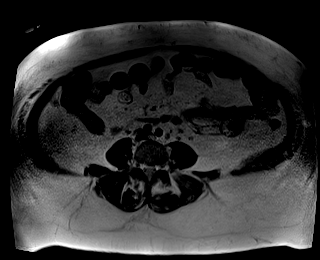

[Series 10: ax in and · axial · 3.0mm · 1.41mm/px · z∈[-162,+123]mm · 2 of 96 slices shown (1 of 2)]
[im 1/96]
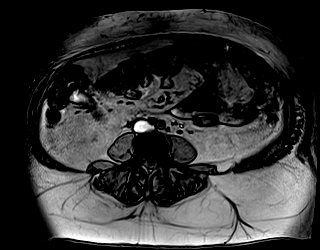
[im 96/96]
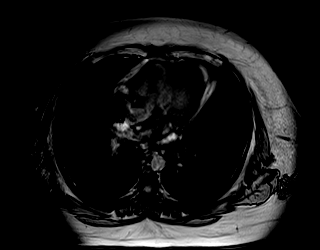

[Series 11: ax in and · axial · 3.0mm · 1.41mm/px · z∈[-162,+123]mm · 2 of 96 slices shown (2 of 2)]
[im 1/96]
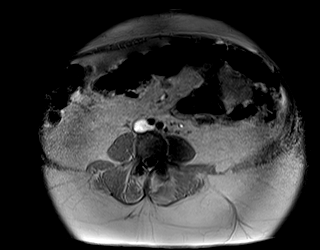
[im 96/96]
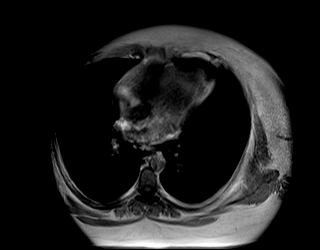

[Series 12: bSSFP · axial · 6.0mm · 0.88mm/px · 1 of 52 slices shown]
[im 1/52]
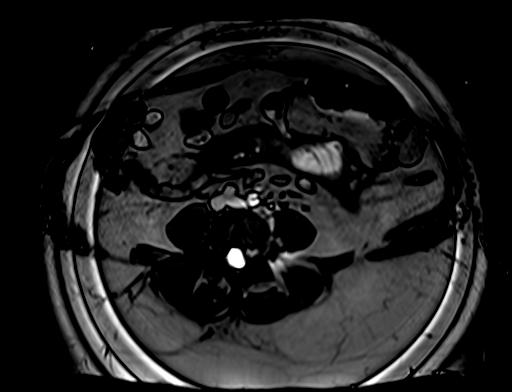

[Series 13: T1 dynamic · axial · 3.5mm · 1.41mm/px · z∈[-158,+119]mm · 3 of 80 slices shown]
[im 1/80]
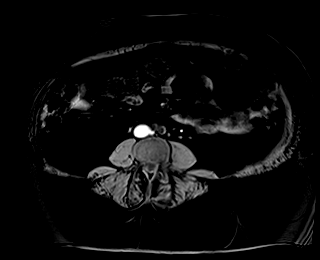
[im 40/80]
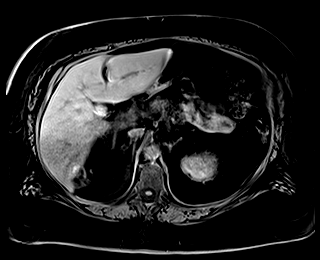
[im 80/80]
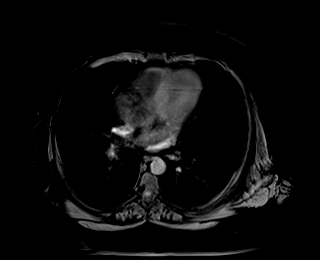

[Series 14: T1 dynamic post-contrast · axial · 3.5mm · 1.41mm/px · z∈[-158,+119]mm · 3 of 80 slices shown (1 of 11)]
[im 1/80]
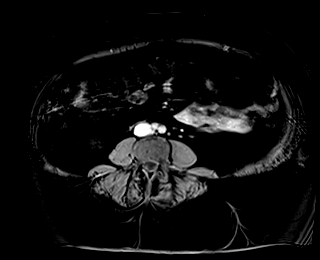
[im 40/80]
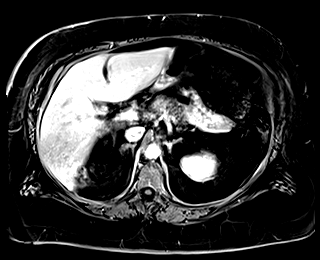
[im 80/80]
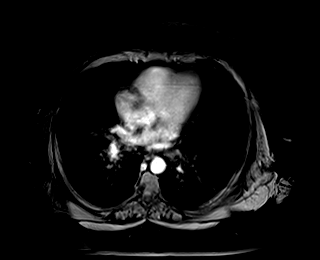

[Series 15: T1 dynamic post-contrast · axial · 3.5mm · 1.41mm/px · z∈[-158,+119]mm · 3 of 80 slices shown (2 of 11)]
[im 1/80]
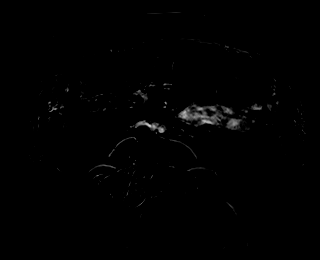
[im 40/80]
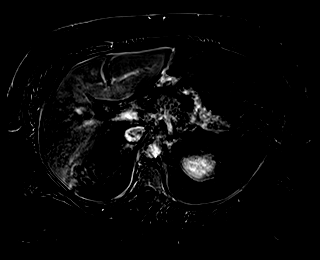
[im 80/80]
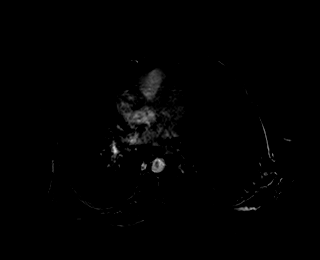

[Series 16: T1 dynamic post-contrast · axial · 3.5mm · 1.41mm/px · z∈[-158,+119]mm · 3 of 80 slices shown (3 of 11)]
[im 1/80]
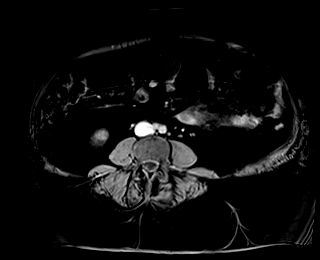
[im 40/80]
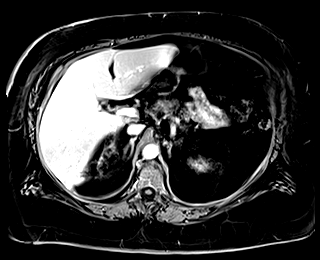
[im 80/80]
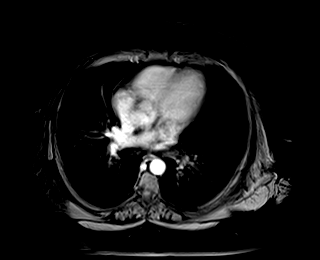

[Series 17: T1 dynamic post-contrast · axial · 3.5mm · 1.41mm/px · z∈[-158,+119]mm · 3 of 80 slices shown (4 of 11)]
[im 1/80]
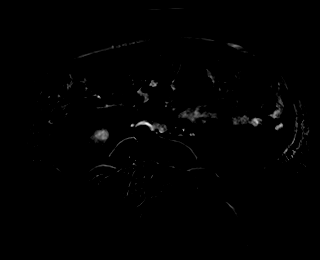
[im 40/80]
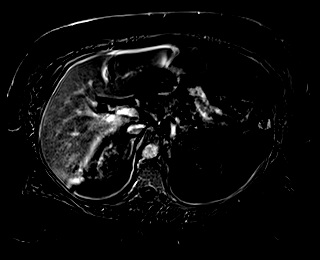
[im 80/80]
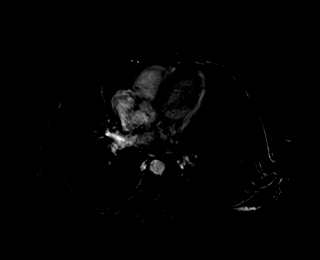

[Series 18: T1 dynamic post-contrast · axial · 3.5mm · 1.41mm/px · z∈[-158,+119]mm · 3 of 80 slices shown (5 of 11)]
[im 1/80]
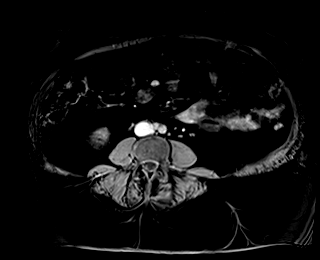
[im 40/80]
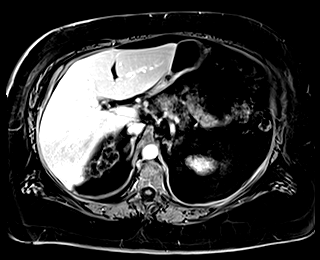
[im 80/80]
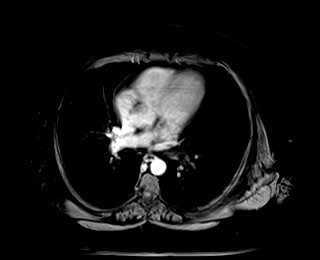

[Series 19: T1 dynamic post-contrast · axial · 3.5mm · 1.41mm/px · z∈[-158,+119]mm · 3 of 80 slices shown (6 of 11)]
[im 1/80]
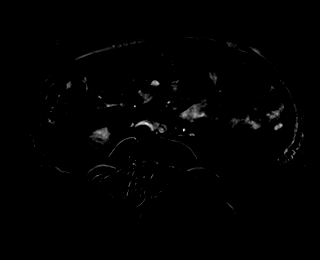
[im 40/80]
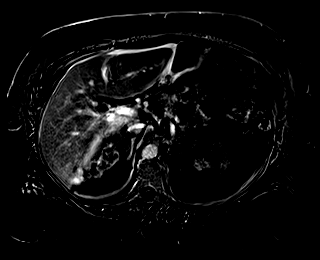
[im 80/80]
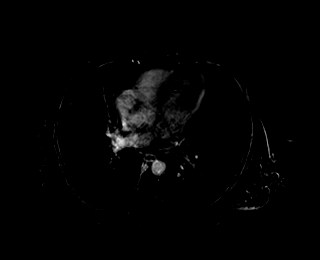

[Series 20: T1 dynamic post-contrast · coronal · 3.0mm · 1.41mm/px · 3 of 80 slices shown (7 of 11)]
[im 1/80]
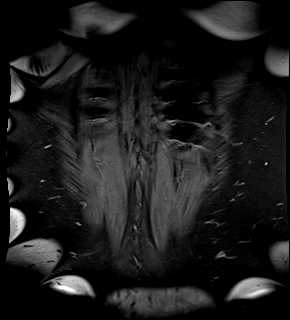
[im 40/80]
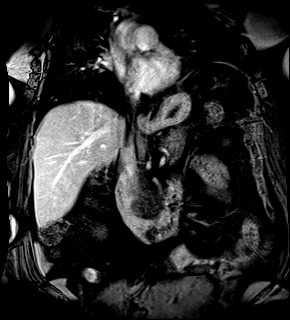
[im 80/80]
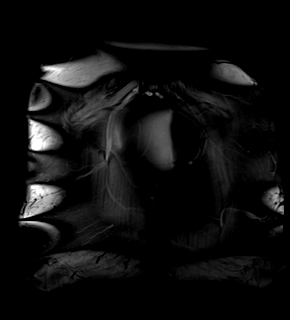

[Series 21: T1 dynamic post-contrast · axial · 3.5mm · 1.41mm/px · z∈[-158,+119]mm · 3 of 80 slices shown (8 of 11)]
[im 1/80]
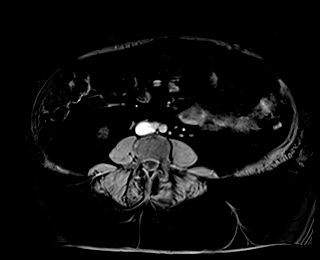
[im 40/80]
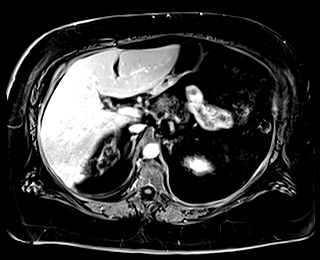
[im 80/80]
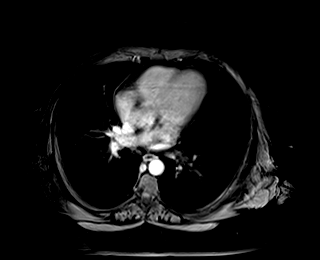

[Series 22: T1 dynamic post-contrast · axial · 3.5mm · 1.41mm/px · z∈[-158,+119]mm · 3 of 80 slices shown (9 of 11)]
[im 1/80]
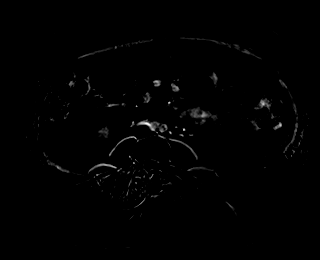
[im 40/80]
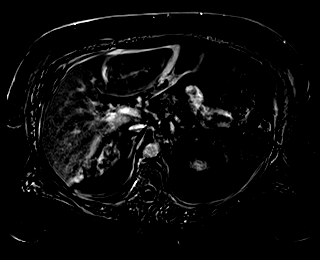
[im 80/80]
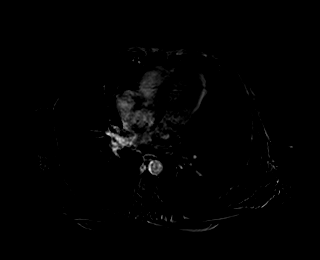

[Series 23: T1 dynamic post-contrast · axial · 3.5mm · 1.41mm/px · z∈[-158,+119]mm · 3 of 80 slices shown (10 of 11)]
[im 1/80]
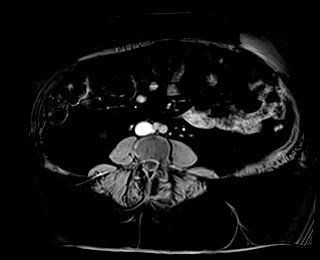
[im 40/80]
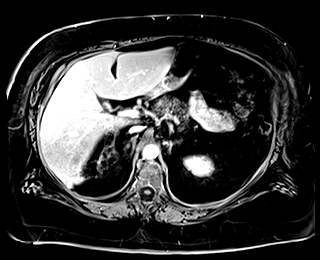
[im 80/80]
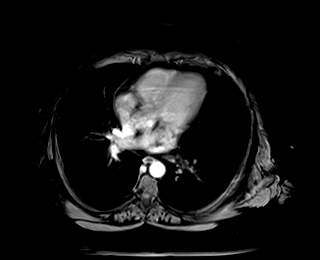

[Series 24: T1 dynamic post-contrast · axial · 3.5mm · 1.41mm/px · z∈[-158,+119]mm · 3 of 80 slices shown (11 of 11)]
[im 1/80]
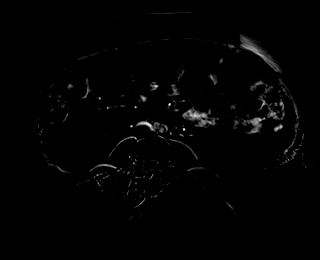
[im 40/80]
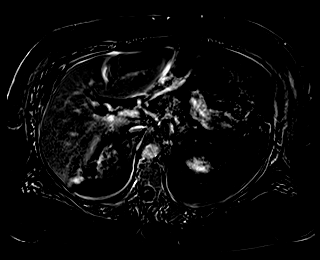
[im 80/80]
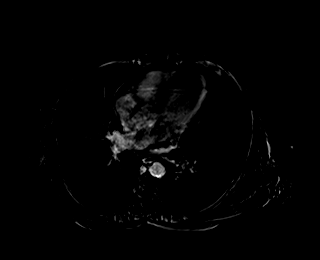

[48 of 48 positions shown; findings below may reference images not displayed]

FINDINGS: Lower chest: Stable cardiomegaly.

Hepatobiliary: Mild diffuse hepatic steatosis.

Stable subtle mildly T2 hyperintense lesion in the left lobe of the
liver measuring 1.6 cm on image [DATE], unchanged. Lesion demonstrates
review reduced diffusivity and is intrinsically T1 isointense to
background without significant early arterial postcontrast
enhancement but slowly progressive level of enhancement on delayed
postcontrast sequences slightly less than that of background aorta
but greater than that background liver. No new suspicious hepatic
lesions.

Gallbladder is unremarkable.  No biliary ductal dilation.

Pancreas: No pancreatic ductal dilation or evidence of acute
inflammation.

Spleen:  Within normal limits in size and appearance.

Adrenals/Urinary Tract: Bilateral adrenal glands appear normal. No
hydronephrosis. Tiny left renal cysts measure up to 8 mm are stable
from prior and statistically likely benign requiring no independent
imaging follow-up.

Stomach/Bowel: Visualized portions within the abdomen are
unremarkable.

Vascular/Lymphatic: Normal caliber abdominal aorta. No
pathologically enlarged abdominal lymph nodes.

Other:  No significant abdominal free fluid.

Musculoskeletal: No suspicious bone lesions identified.
IMPRESSION: 1. Stable 1.6 cm lesion in the left lobe of the liver which
demonstrates nonspecific imaging characteristics but is likely
stable dating back to [6D] and almost certainly reflects a benign
finding with primary differential consideration of an atypical
sclerosing hemangioma. Consider follow-up hepatic protocol MRI with
and without contrast in 1 year for further assessment of stability,
and if unchanged at that could be definitively characterized as
benign.
2. Tiny left renal cysts measure up to 8 mm, while difficult to
characterize given small size do not demonstrate suspicious features
as well are stable from prior and statistically likely benign
requiring no independent imaging follow-up.

## 2021-09-03 MED ORDER — GADOBUTROL 1 MMOL/ML IV SOLN
10.0000 mL | Freq: Once | INTRAVENOUS | Status: AC | PRN
  Administered 2021-09-03: 10 mL via INTRAVENOUS

## 2021-11-16 ENCOUNTER — Ambulatory Visit: Payer: BC Managed Care – PPO

## 2021-11-16 DIAGNOSIS — G4733 Obstructive sleep apnea (adult) (pediatric): Secondary | ICD-10-CM | POA: Diagnosis not present

## 2021-11-16 DIAGNOSIS — G4710 Hypersomnia, unspecified: Secondary | ICD-10-CM

## 2021-11-25 ENCOUNTER — Telehealth: Payer: Self-pay | Admitting: Pulmonary Disease

## 2021-11-25 DIAGNOSIS — G4733 Obstructive sleep apnea (adult) (pediatric): Secondary | ICD-10-CM | POA: Diagnosis not present

## 2021-11-25 NOTE — Telephone Encounter (Signed)
HST showed moderate OSA with AHI 12/hour but saturation was severe to 74% Since she is symptomatic, would suggest trial of CPAP if she is willing to pursue Auto CPAP 5 to 12 cm with mask of choice -she is a mouth breather so may very well need a fullface mask but can trial nasal pillows  Office visit 1 month after starting

## 2021-11-26 NOTE — Telephone Encounter (Signed)
Called and spoke to patient. She voiced understanding of results. She wants to know if her hands going numb at night could alter the results of the study. She states she has this happen frequently and on the night of the sleep study she had numbness in her hands. She also wants to know what other options there are for her instead of the CPAP machine to treat her OSA.  Please advise.

## 2021-11-26 NOTE — Telephone Encounter (Signed)
Called and went over note with patient from Dr. Vassie Loll. Patient states she would like to think about it some more before deciding on CPAP or oral appliance. I did tell her that ultimately he recommends a CPAP machine and she voiced understanding. Patient is going to call back next week and let us know what she decides on.

## 2022-01-02 ENCOUNTER — Other Ambulatory Visit: Payer: Self-pay | Admitting: Cardiology

## 2022-02-28 ENCOUNTER — Other Ambulatory Visit: Payer: Self-pay | Admitting: *Deleted

## 2022-02-28 MED ORDER — CHLORTHALIDONE 25 MG PO TABS: 12.5000 mg | ORAL_TABLET | Freq: Every day | ORAL | 1 refills | Status: DC

## 2022-03-03 ENCOUNTER — Other Ambulatory Visit: Payer: Self-pay | Admitting: *Deleted

## 2022-03-03 DIAGNOSIS — I6529 Occlusion and stenosis of unspecified carotid artery: Secondary | ICD-10-CM

## 2022-03-09 ENCOUNTER — Ambulatory Visit (HOSPITAL_COMMUNITY)
Admission: RE | Admit: 2022-03-09 | Discharge: 2022-03-09 | Disposition: A | Discharge status: Home / Self Care | Payer: BC Managed Care – PPO | Source: Ambulatory Visit | Attending: Vascular Surgery | Admitting: Vascular Surgery

## 2022-03-09 ENCOUNTER — Ambulatory Visit: Payer: BC Managed Care – PPO | Admitting: Physician Assistant

## 2022-03-09 VITALS — BP 137/90 | HR 88 | Temp 97.9°F | Ht 67.0 in | Wt 298.0 lb

## 2022-03-09 DIAGNOSIS — M7989 Other specified soft tissue disorders: Secondary | ICD-10-CM

## 2022-03-09 DIAGNOSIS — I872 Venous insufficiency (chronic) (peripheral): Secondary | ICD-10-CM | POA: Diagnosis not present

## 2022-03-09 DIAGNOSIS — R609 Edema, unspecified: Secondary | ICD-10-CM | POA: Diagnosis not present

## 2022-03-09 DIAGNOSIS — I6529 Occlusion and stenosis of unspecified carotid artery: Secondary | ICD-10-CM | POA: Insufficient documentation

## 2022-03-09 NOTE — Progress Notes (Signed)
Requested by:  Royann Shivers, PA-C [address] [place],  [place] [address]  Reason for consultation:  leg swelling    History of Present Illness   Yvette Patterson is a 46 y.o. (July 19, 1975) female who presents for evaluation of swelling. Left leg is more than right. She explains that over a year ago she began having swelling. Her blood pressure medication was changed by her PCP and she has had almost complete resolution of her swelling following that. However just a couple months ago she reported that she started to have some other weird sensations in her legs like cramping, "electrical impulses", and aching.  She has some spider veins as well. These do not bother her. She denies any itching, burning, stinging. She does elevate with some improvement. She has never worn compression stockings. She does a lot of sitting for her work. She does have some family history of venous disease in her mother. No history of DVT.   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    Past Surgical History:  Procedure Laterality Date   BIOPSY  10/30/2020   Procedure: BIOPSY;  Surgeon: Lanelle Bal, DO;  [facility];  Service: Endoscopy;;   COLONOSCOPY WITH PROPOFOL N/A 10/30/2020   Procedure: COLONOSCOPY WITH PROPOFOL;  Surgeon: Lanelle Bal, DO;  [facility];  Service: Endoscopy;  Laterality: N/A;  8:30am   HYSTEROSCOPY     KNEE SURGERY     OVARIAN CYST  REMOVAL      Social History   Socioeconomic History   Marital status: Married    Spouse name: Not on file   Number of children: 1   Years of education: Not on file   Highest education level: Not on file  Occupational History   Occupation: school counselor  Tobacco Use   Smoking status: Never   Smokeless tobacco: Never  Vaping Use   Vaping Use: Never used  Substance and Sexual Activity   Alcohol use: Yes    Comment: occ   Drug use: No   Sexual activity: Not on file  Other Topics Concern   Not on file  Social History Narrative   Lives with husband and son   Right Handed   Drinks 2-4 cup caffeine daily   Social Determinants of Health   Financial Resource Strain: Not on file  Food Insecurity: Not on file  Transportation Needs: Not on file  Physical Activity: Not on file  Stress: Not on file  Social Connections: Not on file  Intimate Partner Violence: Not on file    Family History  Problem Relation Age of Onset   Colon cancer Father    Diabetes Father    Stroke Father    Kidney Stones Mother    Colon cancer Paternal Grandfather     Current Outpatient Medications  Medication Sig Dispense Refill   acetaminophen (TYLENOL) 500 MG tablet Take 1,000 mg by mouth every 6 (six) hours as  needed for moderate pain, mild pain, headache or fever.     chlorthalidone (HYGROTON) 25 MG tablet Take 0.5 tablets (12.5 mg total) by mouth daily. 45 tablet 1   Cyanocobalamin (VITAMIN B 12 PO) Take by mouth every other day.     fluticasone (FLONASE) 50 MCG/ACT nasal spray Place 2 sprays into both nostrils daily as needed for allergies. As needed     furosemide (LASIX) 20 MG tablet Take 20 mg by mouth daily as needed.     levocetirizine (XYZAL) 5 MG tablet Take 5 mg by mouth daily as needed for allergies.     losartan (COZAAR) 100 MG tablet Take 100 mg by mouth daily.     metFORMIN (GLUCOPHAGE-XR) 500 MG 24 hr tablet 500 mg daily with breakfast.     mometasone (ELOCON) 0.1 % ointment  Apply 1 application topically at bedtime as needed (Eczema).     naproxen sodium (ALEVE) 220 MG tablet Take 220 mg by mouth daily as needed (pain).     No current facility-administered medications for this visit.    Allergies  Allergen Reactions   Lisinopril Swelling    Facial swelling     REVIEW OF SYSTEMS (negative unless checked):   Cardiac:  []  Chest pain or chest pressure? []  Shortness of breath upon activity? []  Shortness of breath when lying flat? []  Irregular heart rhythm?  Vascular:  []  Pain in calf, thigh, or hip brought on by walking? []  Pain in feet at night that wakes you up from your sleep? []  Blood clot in your veins? []  Leg swelling?  Pulmonary:  []  Oxygen at home? []  Productive cough? []  Wheezing?  Neurologic:  []  Sudden weakness in arms or legs? []  Sudden numbness in arms or legs? []  Sudden onset of difficult speaking or slurred speech? []  Temporary loss of vision in one eye? []  Problems with dizziness?  Gastrointestinal:  []  Blood in stool? []  Vomited blood?  Genitourinary:  []  Burning when urinating? []  Blood in urine?  Psychiatric:  []  Major depression  Hematologic:  []  Bleeding problems? []  Problems with blood clotting?  Dermatologic:  []  Rashes or ulcers?  Constitutional:  []  Fever or chills?  Ear/Nose/Throat:  []  Change in hearing? []  Nose bleeds? []  Sore throat?  Musculoskeletal:  []  Back pain? []  Joint pain? []  Muscle pain?   Physical Examination     Vitals:   03/09/22 1400  BP: (!) 137/90  Pulse: 88  Temp: 97.9 F (36.6 C)  TempSrc: Temporal  SpO2: 100%  Weight: 298 lb (135.2 kg)  Height: 5\' 7"  (1.702 m)   Body mass index is 46.67 kg/m.  General:  WDWN in NAD; vital signs documented above Gait: Normal HENT: WNL, normocephalic Pulmonary: normal non-labored breathing  Cardiac: regular HR Vascular Exam/Pulses:2+ radial and distal pulses bilaterally Extremities: without varicose veins, with reticular  veins, without edema, without stasis pigmentation, without lipodermatosclerosis, without ulcers Musculoskeletal: no muscle wasting or atrophy  Neurologic: A&O X 3;  No focal weakness or paresthesias are detected Psychiatric:  The pt has Normal affect.  Non-invasive Vascular Imaging   BLE Venous Insufficiency Duplex (03/09/22):   LLE: No DVT and SVT GSV reflux SFJ and proximal thigh GSV diameter 0.49-1.2 cm No SSV reflux  CFV deep venous reflux   Medical Decision Making   SUNDUS PETE is a 46 y.o. female who presents with: LLE chronic venous insufficiency with discomfort and swelling. Duplex today shows no DVT or SVT. She does have deep reflux in her CFV.  She also has superficial venous reflux in her GSV at [facility] and proximal thigh. No SSV reflux. Her GSV is of adequate size to be considered candidate for lazer venous ablation. Based on the patient's history and examination, I recommend: elevation daily above level of heart, exercise therapy, weight management, refraining from prolonged sitting or standing and compression stockings. I discussed with the patient the use of her 20-30 mm thigh high compression stockings and need for 3 month trial of such. The patient will follow up in 3 months with MD to further be evaluated and discuss lazer ablation   Graceann Congress, PA-C [facility] Office: [phone]  03/09/2022, 2:16 PM  Clinic MD: [place]ickson/ Randie Heinz

## 2022-06-22 ENCOUNTER — Ambulatory Visit: Payer: BC Managed Care – PPO | Admitting: Vascular Surgery

## 2022-06-30 ENCOUNTER — Telehealth: Payer: Self-pay | Admitting: Pulmonary Disease

## 2022-06-30 NOTE — Telephone Encounter (Signed)
No note of attempt to call patient in patients chart.

## 2022-07-06 ENCOUNTER — Encounter: Payer: Self-pay | Admitting: Vascular Surgery

## 2022-07-06 ENCOUNTER — Ambulatory Visit: Payer: BC Managed Care – PPO | Admitting: Vascular Surgery

## 2022-07-06 VITALS — BP 111/78 | HR 72 | Temp 98.2°F | Resp 20 | Ht 67.0 in | Wt 286.7 lb

## 2022-07-06 DIAGNOSIS — I872 Venous insufficiency (chronic) (peripheral): Secondary | ICD-10-CM | POA: Diagnosis not present

## 2022-07-06 NOTE — Progress Notes (Signed)
Patient ID: Yvette Patterson, female   DOB: January 13, 1976, 47 y.o.   MRN: [id number]  Reason for Consult: No chief complaint on file.   Referred by Rosalee Kaufman, *  Subjective:     HPI:  Yvette Patterson is a 47 y.o. female follows up today for evaluation left lower extremity swelling.  She states that since her last evaluation she has had some improvement of her symptoms but is also lost approximately 15 pounds with the help of a nutritionist in [place].  She has incompletely been compliant with compression stockings has occasionally worn the knee-high but only once worn the thigh-high stockings.  She does have some redness and heaviness to the left leg that remains as well as spider veins but no varicosities.  She denies any history of DVT.  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   Family History  Problem Relation Age of Onset   Colon cancer Father    Diabetes Father    Stroke Father    Kidney Stones Mother    Colon cancer Paternal Grandfather    Past Surgical History:  Procedure Laterality Date   BIOPSY  10/30/2020   Procedure: BIOPSY;  Surgeon: Eloise Harman, DO;  [facility];  Service: Endoscopy;;   COLONOSCOPY WITH PROPOFOL N/A 10/30/2020   Procedure: COLONOSCOPY WITH PROPOFOL;  Surgeon: Eloise Harman, DO;  [facility];  Service: Endoscopy;  Laterality: N/A;  8:30am   HYSTEROSCOPY     KNEE SURGERY     OVARIAN CYST REMOVAL      Short Social History:  Social History   Tobacco Use   Smoking status: Never   Smokeless tobacco: Never  Substance Use Topics   Alcohol use: Yes    Comment: occ    Allergies  Allergen Reactions   Lisinopril Swelling    Facial swelling     Current Outpatient  Medications  Medication Sig Dispense Refill   acetaminophen (TYLENOL) 500 MG tablet Take 1,000 mg by mouth every 6 (six) hours as needed for moderate pain, mild pain, headache or fever.     chlorthalidone (HYGROTON) 25 MG tablet Take 0.5 tablets (12.5 mg total) by mouth daily. 45 tablet 1   Cyanocobalamin (VITAMIN B 12 PO) Take by mouth every other day.     fluticasone (FLONASE) 50 MCG/ACT nasal spray Place 2 sprays into both nostrils daily as needed for allergies. As needed     furosemide (LASIX) 20 MG tablet Take 20 mg by mouth daily as needed.     levocetirizine (XYZAL) 5 MG tablet Take 5 mg by mouth daily as needed for allergies.     losartan (COZAAR) 100 MG tablet Take 100 mg by mouth daily.     metFORMIN (GLUCOPHAGE-XR) 500 MG 24 hr tablet 500 mg daily with breakfast.     mometasone (ELOCON) 0.1 % ointment Apply 1 application topically at bedtime as needed (Eczema).     naproxen sodium (ALEVE) 220 MG tablet Take 220 mg by mouth daily as needed (pain).     No current facility-administered medications for this visit.    Review of Systems  Constitutional:       Intentional 15lb weight loss  HENT:  HENT negative.  Eyes: Eyes negative.  Respiratory: Respiratory negative.  Cardiovascular: Cardiovascular negative.  GI: Gastrointestinal negative.  Musculoskeletal: Musculoskeletal negative.  Skin: Skin negative.  Neurological: Neurological negative. Hematologic: Hematologic/lymphatic negative.  Psychiatric: Psychiatric negative.        Objective:  Objective   Vitals:   07/06/22 1051  BP: 111/78  Pulse: 72  Resp: 20  Temp: 98.2 F (36.8 C)  SpO2: 95%     Physical Exam HENT:     Head: Normocephalic.     Nose: Nose normal.  Eyes:     Pupils: Pupils are equal, round, and reactive to light.  Cardiovascular:     Rate and Rhythm: Normal rate.  Pulmonary:     Effort: Pulmonary effort is normal.  Abdominal:     General: Abdomen is flat.     Palpations: Abdomen is soft.   Musculoskeletal:        General: Normal range of motion.     Cervical back: Normal range of motion and neck supple.     Right lower leg: No edema.     Left lower leg: No edema.  Skin:    General: Skin is warm.     Capillary Refill: Capillary refill takes less than 2 seconds.  Neurological:     General: No focal deficit present.     Mental Status: She is alert.  Psychiatric:        Mood and Affect: Mood normal.        Behavior: Behavior normal.        Thought Content: Thought content normal.        Judgment: Judgment normal.    Data: LEFT          Reflux NoRefluxReflux TimeDiameter cmsComments                          Yes                                   +--------------+---------+------+-----------+------------+--------+  CFV                    yes   >1 second                       +--------------+---------+------+-----------+------------+--------+  FV mid        no                                              +--------------+---------+------+-----------+------------+--------+  Popliteal    no                                              +--------------+---------+------+-----------+------------+--------+  GSV at SFJ              yes    >500 ms      1.2               +--------------+---------+------+-----------+------------+--------+  GSV prox thigh          yes    >500 ms   0.57 / 1.2           +--------------+---------+------+-----------+------------+--------+  GSV mid  thigh no                            0.49              +--------------+---------+------+-----------+------------+--------+  GSV dist thighno                            0.45              +--------------+---------+------+-----------+------------+--------+  GSV at knee   no                            0.49              +--------------+---------+------+-----------+------------+--------+  GSV prox calf no                            0.24               +--------------+---------+------+-----------+------------+--------+  GSV mid calf  no                                    nv        +--------------+---------+------+-----------+------------+--------+  SSV Pop Fossa no                            0.20              +--------------+---------+------+-----------+------------+--------+  SSV prox calf no                            0.19              +--------------+---------+------+-----------+------------+--------+  SSV mid calf  no                            0.21              +--------------+---------+------+-----------+------------+--------+  AASV O        no                            0.7               +--------------+---------+------+-----------+------------+--------+  AASV P        no                            0.49              +--------------+---------+------+-----------+------------+--------+         Summary:  Left:  - No evidence of deep vein thrombosis seen in the left lower extremity,  from the common femoral through the popliteal veins.  - No evidence of superficial venous thrombosis in the left lower  extremity.  - No evidence of superficial venous reflux seen in the left short  saphenous vein.  - Venous reflux is noted in the left common femoral vein.  - Venous reflux is noted in the left sapheno-femoral junction.  - Venous reflux is noted in the left greater saphenous vein in the thigh.         Assessment/Plan:  47 year old female with C3 venous disease with reflux and a short segment of her great saphenous vein.  She does have some early skin changes of the leg with redness and also has heaviness but has also lost 15 pounds recently as has improvement of the swelling.  I recommended continued weight loss and compression stockings and she can call to have sclerotherapy performed if she would like for cosmetic standpoint.  From a reflux standpoint we can consider intervention if  her symptoms do not resolve with continued weight loss and compression stockings and she will call to follow-up on an as-needed basis.     Waynetta Sandy MD [facility]

## 2022-08-24 ENCOUNTER — Other Ambulatory Visit: Payer: Self-pay | Admitting: Cardiology

## 2022-09-01 ENCOUNTER — Encounter: Payer: Self-pay | Admitting: Internal Medicine

## 2022-09-19 ENCOUNTER — Telehealth: Payer: Self-pay | Admitting: Internal Medicine

## 2022-09-19 ENCOUNTER — Other Ambulatory Visit: Payer: Self-pay | Admitting: *Deleted

## 2022-09-19 DIAGNOSIS — K769 Liver disease, unspecified: Secondary | ICD-10-CM

## 2022-09-19 NOTE — Telephone Encounter (Signed)
Carelon PA: Order ID: [id number]       Authorized  Approval Valid Through: 09/19/2022 - 10/18/2022:

## 2022-09-19 NOTE — Telephone Encounter (Signed)
Pt received letter to schedule her MRI. [phone]

## 2022-09-19 NOTE — Telephone Encounter (Signed)
Pt informed that MRI is scheduled for 10/13/22, arrive at 8:45 am, to check in and NPO 4 hours prior

## 2022-10-04 ENCOUNTER — Other Ambulatory Visit: Payer: Self-pay | Admitting: Cardiology

## 2022-10-12 ENCOUNTER — Ambulatory Visit (HOSPITAL_COMMUNITY)
Admission: RE | Admit: 2022-10-12 | Discharge: 2022-10-12 | Disposition: A | Discharge status: Home / Self Care | Payer: BC Managed Care – PPO | Source: Ambulatory Visit | Attending: Gastroenterology | Admitting: Gastroenterology

## 2022-10-12 DIAGNOSIS — K769 Liver disease, unspecified: Secondary | ICD-10-CM | POA: Diagnosis not present

## 2022-10-12 MED ORDER — GADOBUTROL 1 MMOL/ML IV SOLN
10.0000 mL | Freq: Once | INTRAVENOUS | Status: AC | PRN
  Administered 2022-10-12: 10 mL via INTRAVENOUS

## 2022-10-13 ENCOUNTER — Ambulatory Visit (HOSPITAL_COMMUNITY): Payer: BC Managed Care – PPO

## 2022-10-19 ENCOUNTER — Ambulatory Visit (HOSPITAL_COMMUNITY): Payer: BC Managed Care – PPO

## 2024-04-01 ENCOUNTER — Other Ambulatory Visit: Payer: Self-pay | Admitting: Cardiology

## 2024-05-10 ENCOUNTER — Other Ambulatory Visit: Payer: Self-pay | Admitting: Cardiology
